# Patient Record
Sex: Female | Born: 1960 | Race: Black or African American | Hispanic: No | Marital: Single | Smoking: Former smoker
Health system: Southern US, Community
[De-identification: ages and names within clinical notes are randomized; demographics above are authoritative.]

## PROBLEM LIST (undated history)

## (undated) DIAGNOSIS — E119 Type 2 diabetes mellitus without complications: Secondary | ICD-10-CM

## (undated) DIAGNOSIS — M542 Cervicalgia: Secondary | ICD-10-CM

## (undated) DIAGNOSIS — Z87891 Personal history of nicotine dependence: Secondary | ICD-10-CM

## (undated) DIAGNOSIS — B009 Herpesviral infection, unspecified: Secondary | ICD-10-CM

## (undated) DIAGNOSIS — Z862 Personal history of diseases of the blood and blood-forming organs and certain disorders involving the immune mechanism: Secondary | ICD-10-CM

## (undated) DIAGNOSIS — D219 Benign neoplasm of connective and other soft tissue, unspecified: Secondary | ICD-10-CM

## (undated) HISTORY — DX: Cervicalgia: M54.2

## (undated) HISTORY — DX: Herpesviral infection, unspecified: B00.9

## (undated) HISTORY — DX: Personal history of diseases of the blood and blood-forming organs and certain disorders involving the immune mechanism: Z86.2

## (undated) HISTORY — DX: Type 2 diabetes mellitus without complications: E11.9

## (undated) HISTORY — DX: Benign neoplasm of connective and other soft tissue, unspecified: D21.9

## (undated) HISTORY — DX: Personal history of nicotine dependence: Z87.891

## (undated) HISTORY — PX: NO PAST SURGERIES: SHX2092

---

## 2007-01-21 ENCOUNTER — Emergency Department (HOSPITAL_COMMUNITY): Admission: EM | Admit: 2007-01-21 | Discharge: 2007-01-21 | Payer: Self-pay | Admitting: Emergency Medicine

## 2010-03-18 ENCOUNTER — Emergency Department (HOSPITAL_BASED_OUTPATIENT_CLINIC_OR_DEPARTMENT_OTHER)
Admission: EM | Admit: 2010-03-18 | Discharge: 2010-03-18 | Payer: Self-pay | Source: Home / Self Care | Admitting: Emergency Medicine

## 2010-04-02 ENCOUNTER — Ambulatory Visit: Admission: RE | Admit: 2010-04-02 | Payer: Self-pay | Source: Home / Self Care | Admitting: Family Medicine

## 2010-04-02 ENCOUNTER — Encounter: Payer: Self-pay | Admitting: Family Medicine

## 2010-04-02 DIAGNOSIS — S139XXA Sprain of joints and ligaments of unspecified parts of neck, initial encounter: Secondary | ICD-10-CM | POA: Insufficient documentation

## 2010-04-11 ENCOUNTER — Encounter: Admit: 2010-04-11 | Discharge: 2010-04-11 | Payer: Self-pay | Attending: Family Medicine | Admitting: Family Medicine

## 2010-04-25 ENCOUNTER — Encounter
Admission: RE | Admit: 2010-04-25 | Discharge: 2010-04-29 | Payer: Self-pay | Source: Home / Self Care | Attending: Family Medicine | Admitting: Family Medicine

## 2010-05-01 ENCOUNTER — Ambulatory Visit: Payer: PRIVATE HEALTH INSURANCE | Admitting: Rehabilitation
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
## 2010-07-08 ENCOUNTER — Emergency Department (HOSPITAL_BASED_OUTPATIENT_CLINIC_OR_DEPARTMENT_OTHER)
Admission: EM | Admit: 2010-07-08 | Discharge: 2010-07-08 | Disposition: A | Discharge status: Home / Self Care | Payer: PRIVATE HEALTH INSURANCE | Attending: Emergency Medicine | Admitting: Emergency Medicine

## 2010-07-08 DIAGNOSIS — M7989 Other specified soft tissue disorders: Secondary | ICD-10-CM | POA: Insufficient documentation

## 2010-07-08 DIAGNOSIS — L089 Local infection of the skin and subcutaneous tissue, unspecified: Secondary | ICD-10-CM | POA: Insufficient documentation

## 2010-10-13 ENCOUNTER — Emergency Department (HOSPITAL_COMMUNITY): Payer: PRIVATE HEALTH INSURANCE

## 2010-10-13 ENCOUNTER — Emergency Department (HOSPITAL_COMMUNITY)
Admission: EM | Admit: 2010-10-13 | Discharge: 2010-10-13 | Disposition: A | Discharge status: Home / Self Care | Payer: PRIVATE HEALTH INSURANCE | Attending: Emergency Medicine | Admitting: Emergency Medicine

## 2010-10-13 DIAGNOSIS — R05 Cough: Secondary | ICD-10-CM | POA: Insufficient documentation

## 2010-10-13 DIAGNOSIS — J40 Bronchitis, not specified as acute or chronic: Secondary | ICD-10-CM | POA: Insufficient documentation

## 2010-10-13 DIAGNOSIS — R059 Cough, unspecified: Secondary | ICD-10-CM | POA: Insufficient documentation

## 2010-10-13 DIAGNOSIS — R062 Wheezing: Secondary | ICD-10-CM | POA: Insufficient documentation

## 2010-10-31 ENCOUNTER — Ambulatory Visit (INDEPENDENT_AMBULATORY_CARE_PROVIDER_SITE_OTHER): Payer: PRIVATE HEALTH INSURANCE | Admitting: Critical Care Medicine

## 2010-10-31 ENCOUNTER — Other Ambulatory Visit: Payer: Self-pay | Admitting: Critical Care Medicine

## 2010-10-31 ENCOUNTER — Encounter: Payer: Self-pay | Admitting: Critical Care Medicine

## 2010-10-31 VITALS — BP 116/77 | HR 49 | Temp 98.1°F | Ht 63.0 in | Wt 227.0 lb

## 2010-10-31 DIAGNOSIS — R05 Cough: Secondary | ICD-10-CM

## 2010-10-31 DIAGNOSIS — J40 Bronchitis, not specified as acute or chronic: Secondary | ICD-10-CM

## 2010-10-31 DIAGNOSIS — R059 Cough, unspecified: Secondary | ICD-10-CM

## 2010-10-31 DIAGNOSIS — J45991 Cough variant asthma: Secondary | ICD-10-CM | POA: Insufficient documentation

## 2010-10-31 MED ORDER — BUDESONIDE 180 MCG/ACT IN AEPB: 1.0000 | INHALATION_SPRAY | Freq: Two times a day (BID) | RESPIRATORY_TRACT | Status: DC

## 2010-10-31 NOTE — Assessment & Plan Note (Signed)
Suspect Cough variant asthma Suspect lower airway inflammation Plan PFTs Start ICS pulmicort two puff bid No systemic steroids
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
## 2010-10-31 NOTE — Patient Instructions (Signed)
Start Pulmicort two puff twice daily A breathing test will be obtained Labs today Ventolin as needed Return 1 month High POint I will call with results

## 2010-11-01 LAB — ALLERGY FULL PROFILE
Alternaria Alternata: <0.1 kU/L (ref <0.35)
Bermuda Grass: <0.1 kU/L (ref <0.35)
Candida Albicans: <0.1 kU/L (ref <0.35)
Cat Dander: <0.1 kU/L (ref <0.35)
Curvularia lunata: <0.1 kU/L (ref <0.35)
Elm IgE: <0.1 kU/L (ref <0.35)
Fescue: 0.18 kU/L (ref <0.35)
G009 Red Top: 0.19 kU/L (ref <0.35)
Goldenrod: <0.1 kU/L (ref <0.35)
Helminthosporium halodes: <0.1 kU/L (ref <0.35)
IgE (Immunoglobulin E), Serum: 169.7 IU/mL (ref 0.0–180.0)
Lamb's Quarters: <0.1 kU/L (ref <0.35)

## 2010-11-06 NOTE — Progress Notes (Signed)
Quick Note:  Called, spoke with pt. She is aware allergy test positive for multiple allergens per PW. She is aware PW will review this with her in greater detail at next OV. She verbalized understanding of this and ok with this plan. ______

## 2010-11-12 ENCOUNTER — Ambulatory Visit (INDEPENDENT_AMBULATORY_CARE_PROVIDER_SITE_OTHER): Payer: PRIVATE HEALTH INSURANCE | Admitting: Critical Care Medicine

## 2010-11-12 DIAGNOSIS — R059 Cough, unspecified: Secondary | ICD-10-CM

## 2010-11-12 DIAGNOSIS — R05 Cough: Secondary | ICD-10-CM

## 2010-11-12 DIAGNOSIS — J40 Bronchitis, not specified as acute or chronic: Secondary | ICD-10-CM

## 2010-11-12 LAB — PULMONARY FUNCTION TEST

## 2010-11-12 NOTE — Progress Notes (Signed)
PFT done today. 

## 2010-11-20 ENCOUNTER — Encounter: Payer: Self-pay | Admitting: Critical Care Medicine

## 2010-11-20 ENCOUNTER — Telehealth: Payer: Self-pay | Admitting: Critical Care Medicine

## 2010-11-20 DIAGNOSIS — J45991 Cough variant asthma: Secondary | ICD-10-CM

## 2010-11-20 NOTE — Telephone Encounter (Signed)
Pfts show mild obstruction,  Corrected DLCO Pt aware

## 2010-11-28 ENCOUNTER — Encounter: Payer: Self-pay | Admitting: Critical Care Medicine

## 2010-11-28 ENCOUNTER — Ambulatory Visit (INDEPENDENT_AMBULATORY_CARE_PROVIDER_SITE_OTHER): Payer: PRIVATE HEALTH INSURANCE | Admitting: Critical Care Medicine

## 2010-11-28 VITALS — BP 112/66 | HR 57 | Temp 97.7°F | Ht 63.0 in | Wt 226.5 lb

## 2010-11-28 DIAGNOSIS — J45991 Cough variant asthma: Secondary | ICD-10-CM

## 2010-11-28 NOTE — Patient Instructions (Signed)
Stay on pulmicort Return 4 months
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
## 2010-11-28 NOTE — Assessment & Plan Note (Addendum)
Suspect Cough variant asthma Suspect lower airway inflammation FeV1 75% Fef 25 75 92%  TLC 69%  DLCO /VA 140%  11/12/10 Ig E  170 RAST assay positive multiple allergies Improved on ICS Plan  Cont IcS   b id

## 2010-12-05 ENCOUNTER — Ambulatory Visit: Payer: PRIVATE HEALTH INSURANCE | Admitting: Critical Care Medicine

## 2010-12-08 ENCOUNTER — Emergency Department (HOSPITAL_COMMUNITY): Payer: PRIVATE HEALTH INSURANCE

## 2010-12-08 ENCOUNTER — Emergency Department (HOSPITAL_COMMUNITY)
Admission: EM | Admit: 2010-12-08 | Discharge: 2010-12-08 | Disposition: A | Discharge status: Home / Self Care | Payer: PRIVATE HEALTH INSURANCE | Attending: Emergency Medicine | Admitting: Emergency Medicine

## 2010-12-08 DIAGNOSIS — M7989 Other specified soft tissue disorders: Secondary | ICD-10-CM | POA: Insufficient documentation

## 2010-12-08 DIAGNOSIS — M79609 Pain in unspecified limb: Secondary | ICD-10-CM | POA: Insufficient documentation

## 2012-07-30 ENCOUNTER — Ambulatory Visit: Payer: PRIVATE HEALTH INSURANCE | Admitting: Internal Medicine

## 2012-11-17 ENCOUNTER — Encounter: Payer: Self-pay | Admitting: Obstetrics & Gynecology

## 2012-11-18 ENCOUNTER — Ambulatory Visit: Payer: Self-pay | Admitting: Obstetrics & Gynecology

## 2012-11-18 ENCOUNTER — Telehealth: Payer: Self-pay | Admitting: Obstetrics & Gynecology

## 2012-11-18 NOTE — Telephone Encounter (Signed)
Patient called to ask if she could have a pus completed the same day as her annual (8/26). I explained the process for having a PUS completed. She understands the process and is able to come in on Thursday (8/28) if we can get a pus approved. Patient has fibroids and would like to have a pus to determine if they have changed at all. Patient did not mention any symptoms.

## 2012-11-18 NOTE — Telephone Encounter (Signed)
OK to go ahead and precert.  DX:  218.9.

## 2012-11-23 ENCOUNTER — Ambulatory Visit: Payer: Self-pay | Admitting: Obstetrics & Gynecology

## 2012-11-23 NOTE — Telephone Encounter (Signed)
Returning your phone call.

## 2012-11-26 NOTE — Telephone Encounter (Signed)
Patient scheduled for PUS on 12/02/12

## 2012-11-29 ENCOUNTER — Other Ambulatory Visit: Payer: Self-pay | Admitting: Obstetrics & Gynecology

## 2012-11-29 DIAGNOSIS — D219 Benign neoplasm of connective and other soft tissue, unspecified: Secondary | ICD-10-CM

## 2012-12-02 ENCOUNTER — Ambulatory Visit (INDEPENDENT_AMBULATORY_CARE_PROVIDER_SITE_OTHER): Payer: PRIVATE HEALTH INSURANCE | Admitting: Obstetrics & Gynecology

## 2012-12-02 ENCOUNTER — Ambulatory Visit (INDEPENDENT_AMBULATORY_CARE_PROVIDER_SITE_OTHER): Payer: PRIVATE HEALTH INSURANCE

## 2012-12-02 VITALS — BP 124/80 | Ht 63.25 in | Wt 247.0 lb

## 2012-12-02 DIAGNOSIS — K5900 Constipation, unspecified: Secondary | ICD-10-CM

## 2012-12-02 DIAGNOSIS — N852 Hypertrophy of uterus: Secondary | ICD-10-CM

## 2012-12-02 DIAGNOSIS — D259 Leiomyoma of uterus, unspecified: Secondary | ICD-10-CM

## 2012-12-02 DIAGNOSIS — D219 Benign neoplasm of connective and other soft tissue, unspecified: Secondary | ICD-10-CM

## 2012-12-02 DIAGNOSIS — Z789 Other specified health status: Secondary | ICD-10-CM

## 2012-12-02 NOTE — Progress Notes (Signed)
52 y.o.Singlefemale here for a pelvic ultrasound.  Having cramping pelvic pain, especially on the left side.  Known hx of uterine fibroids.  Pt anxious they are larger.  No vaginal bleeding since January2.  Having some hot flashes, mild night sweats.  Still sleeping okay.  Has taken nothing for the pain except OTC medications.  Patient's last menstrual period was 04/25/2012.  Sexually active:  yes  Contraception: no method  FINDINGS: UTERUS: 7.4 x 5.5 x 4.1cm with multiple (9 noted today) fibroids, largest 2.1 x 1.6cm.  Prior ultrasound 2/12 showed uterine 9.8 x 5.6 x 6.3cm with around 10 fibroids noted).  Overall significant decrease in size. EMS: 3.105mm ADNEXA:   Left ovary 1.9 x 0.7 x 0.7cm   Right ovary 2.1 x 1.3 x 1.4cm CUL DE SAC: neg  D/W pt findings.  Images reviewed together.  Feel this is not cause of her pain.  D/W pt other causes of LLQ pain, especially GI causes.  She admitted to using very frequent laxatives until just three months ago.  Pain began after that time.  Advised pt to start daily Colace and use Miralax, in addition, if needed.  She is very Adult nurse and voices understanding.  Assessment:  Uterine fibroids, uterus much smaller in size Constipation  Plan: F/U for AEX about 6 months Colace and Miralax discussed for constipation relief.    ~25 minutes spent with patient >50% of time was in face to face discussion of above.

## 2012-12-03 ENCOUNTER — Ambulatory Visit: Payer: Self-pay | Admitting: Obstetrics & Gynecology

## 2012-12-08 ENCOUNTER — Encounter: Payer: Self-pay | Admitting: Obstetrics & Gynecology

## 2012-12-08 NOTE — Patient Instructions (Signed)
Start Colace 100mg  daily to twice daily.  May use Miralax as needed as well.

## 2013-04-12 ENCOUNTER — Encounter: Payer: Self-pay | Admitting: Obstetrics & Gynecology

## 2013-04-12 ENCOUNTER — Ambulatory Visit: Payer: Self-pay | Admitting: Obstetrics & Gynecology

## 2013-11-08 ENCOUNTER — Ambulatory Visit: Payer: Self-pay | Admitting: Obstetrics & Gynecology

## 2013-11-18 ENCOUNTER — Encounter: Payer: Self-pay | Admitting: Obstetrics and Gynecology

## 2013-11-18 ENCOUNTER — Ambulatory Visit (INDEPENDENT_AMBULATORY_CARE_PROVIDER_SITE_OTHER): Payer: PRIVATE HEALTH INSURANCE | Admitting: Obstetrics and Gynecology

## 2013-11-18 VITALS — BP 118/70 | HR 70 | Resp 18 | Ht 63.0 in | Wt 224.0 lb

## 2013-11-18 DIAGNOSIS — D219 Benign neoplasm of connective and other soft tissue, unspecified: Secondary | ICD-10-CM | POA: Insufficient documentation

## 2013-11-18 DIAGNOSIS — D259 Leiomyoma of uterus, unspecified: Secondary | ICD-10-CM

## 2013-11-18 DIAGNOSIS — N951 Menopausal and female climacteric states: Secondary | ICD-10-CM

## 2013-11-18 DIAGNOSIS — N950 Postmenopausal bleeding: Secondary | ICD-10-CM

## 2013-11-18 DIAGNOSIS — Z1211 Encounter for screening for malignant neoplasm of colon: Secondary | ICD-10-CM

## 2013-11-18 DIAGNOSIS — Z01419 Encounter for gynecological examination (general) (routine) without abnormal findings: Secondary | ICD-10-CM

## 2013-11-18 NOTE — Patient Instructions (Signed)

## 2013-11-18 NOTE — Progress Notes (Signed)
Patient ID: Felicia Ramirez, female   DOB: 06/08/1960, 53 y.o.   MRN: [id number] GYNECOLOGY VISIT  PCP:   Erick Blinks, MD (Endocrinologist)  Referring provider:   HPI: 53 y.o.   Single  African American  female   [id number] with Patient's last menstrual period was 07/29/2013.   here for  AEX.  Some hot flashes and certain foods bring on the heat - spicy foods or pork.  If avoids certain foods, does not have hot flashes.  Some anxiety and agitation.  No prior treatment for depression or anxiety.  Mood swings have improved.  Declines medication that could cause weight gain.  Has a history of abnormal thyroid function.  Due for a recheck.   Has know fibroids of uterus.  Seen last year and had ultrasound. Recommendation for treatment of constipation with Colace and Miralax.   Diagnosed with prediabetes last year.   Hgb:    With Endocrinologist Urine:  Neg UPT;  Negative  GYNECOLOGIC HISTORY: Patient's last menstrual period was 07/29/2013. Sexually active: yes  Partner preference: female Contraception:  none  Menopausal hormone therapy: n/a DES exposure:   no Blood transfusions: no   Sexually transmitted diseases:   HSV GYN procedures and prior surgeries:  none Last mammogram: 11-18-12 wnl: [facility].  Appointment scheduled for next week.               Last pap and high risk HPV testing:   11-12-11 wnl:neg HR HPV History of abnormal pap smear:  no   OB History   Grav Para Term Preterm Abortions TAB SAB Ect Mult Living   1 1 1       1        LIFESTYLE: Exercise:    walking            OTHER HEALTH MAINTENANCE: Tetanus/TDap:  01-30-06 HPV:                   n/a Influenza:           never   Bone density:    n/a Colonoscopy:   never  Cholesterol check:  Unsure--thought she had last year and normal.  Family History  Problem Relation Age of Onset  . Throat cancer Maternal Grandmother   . Asthma Daughter   . Diabetes Mother   . Hypertension Mother   .  Hypertension Sister   . Diabetes Brother   . Hypertension Brother     Patient Active Problem List   Diagnosis Date Noted  . Asthma, cough variant 10/31/2010  . CERVICAL STRAIN 04/02/2010   Past Medical History  Diagnosis Date  . ALLERGIC RHINITIS   . Asthma     cough variant  . HSV-2 (herpes simplex virus 2) infection     + Bloodwork  . History of anemia   . Fibroid   . Former smoker, stopped smoking in distant past     quit 2001  . Diabetes mellitus without complication     History reviewed. No pertinent past surgical history.  ALLERGIES: Review of patient's allergies indicates no known allergies.  Current Outpatient Prescriptions  Medication Sig Dispense Refill  . albuterol (VENTOLIN HFA) 108 (90 BASE) MCG/ACT inhaler Inhale 2 puffs into the lungs every 6 (six) hours as needed. Pt takes as needed       . cyclobenzaprine (FLEXERIL) 5 MG tablet Take 5 mg by mouth as needed for muscle spasms.      . Fe Fum-FePoly-FA-Vit C-Vit B3 (INTEGRA F  PO) Take by mouth. Pt taking only when on cycle       . ibuprofen (ADVIL,MOTRIN) 800 MG tablet Take 800 mg by mouth every 8 (eight) hours as needed.        . Liraglutide (VICTOZA) 18 MG/3ML SOPN Inject 12 Units into the skin daily.      . Multiple Vitamin (DAILY MULTIVITAMIN PO) Take by mouth once. Pt takes 1 daily       . budesonide (PULMICORT FLEXHALER) 180 MCG/ACT inhaler Inhale 1 puff into the lungs 2 (two) times daily.  1 each  5   No current facility-administered medications for this visit.     ROS:  Pertinent items are noted in HPI.  History   Social History  . Marital Status: Single    Spouse Name: N/A    Number of Children: N/A  . Years of Education: N/A   Occupational History  . Not on file.   Social History Main Topics  . Smoking status: Former Smoker -- 0.25 packs/day for 11 years    Types: Cigarettes    Quit date: 05/20/2000  . Smokeless tobacco: Never Used  . Alcohol Use: No  . Drug Use: Not on file  .  Sexual Activity: Yes    Partners: Male    Birth Control/ Protection: None   Other Topics Concern  . Not on file   Social History Narrative  . No narrative on file    PHYSICAL EXAMINATION:    BP 118/70  Pulse 70  Resp 18  Ht 5\' 3"  (1.6 m)  Wt 224 lb (101.606 kg)  BMI 39.69 kg/m2  LMP 07/29/2013   Wt Readings from Last 3 Encounters:  11/18/13 224 lb (101.606 kg)  12/02/12 247 lb (112.038 kg)  11/28/10 226 lb 8 oz (102.74 kg)     Ht Readings from Last 3 Encounters:  11/18/13 5\' 3"  (1.6 m)  12/02/12 5' 3.25" (1.607 m)  11/28/10 5\' 3"  (1.6 m)    General appearance: alert, cooperative and appears stated age Head: Normocephalic, without obvious abnormality, atraumatic Neck: no adenopathy, supple, symmetrical, trachea midline and thyroid not enlarged, symmetric, no tenderness/mass/nodules Lungs: clear to auscultation bilaterally Breasts: Inspection negative, No nipple retraction or dimpling, No nipple discharge or bleeding, No axillary or supraclavicular adenopathy, Normal to palpation without dominant masses Heart: regular rate and rhythm Abdomen: pannus with dark discoloration of skin underneath, soft, non-tender; no masses,  no organomegaly Extremities: extremities normal, atraumatic, no cyanosis or edema Skin: Skin color, texture, turgor normal. No rashes or lesions Lymph nodes: Cervical, supraclavicular, and axillary nodes normal. No abnormal inguinal nodes palpated Neurologic: Grossly normal  Pelvic: External genitalia:  no lesions              Urethra:  normal appearing urethra with no masses, tenderness or lesions              Bartholins and Skenes: normal                 Vagina: normal appearing vagina with normal color and discharge, no lesions              Cervix: normal appearance              Pap and high risk HPV testing done: No.        Bimanual Exam:  Uterus:  uterus is normal size, shape, consistency and nontender. 8 - 9 week size, but exam limited by body  habitus.  Adnexa: normal adnexa in size, nontender and no masses                                      Rectovaginal:  Yes.                                        Confirms above.                                      Anus:  normal sphincter tone, no lesions  ASSESSMENT  Normal gynecologic exam. History of fibroids.  Asymptomatic.  Menopausal symptoms.   Mood swings and agitation.  Prediabetic per patient.   PLAN  Mammogram recommended yearly starting at age 19. Pap smear and high risk HPV testing as above. Counseled on self breast exam, Calcium and vitamin D intake, exercise. See lab orders: Yes.  Check FSH and estradiol.  Discussed treatment of menopausal symptoms with HRT and Effexor or Brisdelle.  Declines treatment at this time.  Menopause brochure.  Referral for colonoscopy.  Return annually or prn   An After Visit Summary was printed and given to the patient.

## 2013-11-19 LAB — ESTRADIOL: ESTRADIOL: 17.5 pg/mL

## 2013-11-19 LAB — FOLLICLE STIMULATING HORMONE: FSH: 56.1 m[IU]/mL

## 2013-12-13 ENCOUNTER — Encounter: Payer: Self-pay | Admitting: Obstetrics and Gynecology

## 2014-01-10 ENCOUNTER — Telehealth: Payer: Self-pay | Admitting: Obstetrics and Gynecology

## 2014-01-10 NOTE — Telephone Encounter (Signed)
Spoke with patient. Advised referral was placed over to Dr.Mann's office. Patient would like number to their office. Number provided to Dr.Mann's office [phone]. Patient is agreeable and verbalizes understanding.  Routing to provider for final review. Patient agreeable to disposition. Will close encounter

## 2014-01-10 NOTE — Telephone Encounter (Signed)
Pt says she does not remember the name of the Dr she was referred to for a colonoscopy. Would like nurse to call her with that information.

## 2014-01-30 ENCOUNTER — Encounter: Payer: Self-pay | Admitting: Obstetrics and Gynecology

## 2014-07-20 ENCOUNTER — Ambulatory Visit (INDEPENDENT_AMBULATORY_CARE_PROVIDER_SITE_OTHER): Payer: PRIVATE HEALTH INSURANCE | Admitting: Neurology

## 2014-07-20 ENCOUNTER — Encounter: Payer: Self-pay | Admitting: Neurology

## 2014-07-20 VITALS — BP 129/91 | HR 68 | Ht 63.0 in | Wt 232.0 lb

## 2014-07-20 DIAGNOSIS — M542 Cervicalgia: Secondary | ICD-10-CM | POA: Diagnosis not present

## 2014-07-20 MED ORDER — DULOXETINE HCL 60 MG PO CPEP: 60.0000 mg | ORAL_CAPSULE | Freq: Every day | ORAL | Status: DC
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
## 2014-12-07 ENCOUNTER — Ambulatory Visit: Payer: PRIVATE HEALTH INSURANCE | Admitting: Obstetrics and Gynecology

## 2014-12-20 ENCOUNTER — Ambulatory Visit: Payer: PRIVATE HEALTH INSURANCE | Admitting: Obstetrics and Gynecology

## 2015-03-02 ENCOUNTER — Other Ambulatory Visit: Payer: Self-pay | Admitting: Allergy and Immunology

## 2015-03-15 ENCOUNTER — Encounter: Payer: Self-pay | Admitting: Obstetrics and Gynecology

## 2015-03-15 ENCOUNTER — Ambulatory Visit (INDEPENDENT_AMBULATORY_CARE_PROVIDER_SITE_OTHER): Payer: PRIVATE HEALTH INSURANCE | Admitting: Obstetrics and Gynecology

## 2015-03-15 VITALS — BP 116/74 | HR 70 | Resp 16 | Ht 64.0 in | Wt 245.4 lb

## 2015-03-15 DIAGNOSIS — Z01419 Encounter for gynecological examination (general) (routine) without abnormal findings: Secondary | ICD-10-CM | POA: Diagnosis not present

## 2015-03-15 DIAGNOSIS — Z113 Encounter for screening for infections with a predominantly sexual mode of transmission: Secondary | ICD-10-CM | POA: Diagnosis not present

## 2015-03-15 DIAGNOSIS — Z0000 Encounter for general adult medical examination without abnormal findings: Secondary | ICD-10-CM

## 2015-03-15 LAB — POCT URINALYSIS DIPSTICK
BILIRUBIN UA: NEGATIVE
GLUCOSE UA: NEGATIVE
Ketones, UA: NEGATIVE
LEUKOCYTES UA: NEGATIVE
NITRITE UA: NEGATIVE
Protein, UA: NEGATIVE
RBC UA: NEGATIVE
Urobilinogen, UA: NEGATIVE
pH, UA: 5

## 2015-03-15 MED ORDER — FLUCONAZOLE 150 MG PO TABS: 150.0000 mg | ORAL_TABLET | Freq: Once | ORAL | Status: DC

## 2015-03-15 MED ORDER — NYSTATIN 100000 UNIT/GM EX POWD: Freq: Three times a day (TID) | CUTANEOUS | Status: DC

## 2015-03-15 NOTE — Patient Instructions (Signed)

## 2015-03-15 NOTE — Progress Notes (Signed)
Patient ID: Felicia Ramirez, female   DOB: Oct 11, 1960, 54 y.o.   MRN: [id number] 54 y.o. G1P1001 Single African American female here for annual exam.    No menses for 1.5 years.  Some cramping periodically.  Hot flashes but these are tolerable.  Not taking Cymbalta.  Some situational stress due to home situation.  Not suicidal.  Trying to purchase a home and separate from her partner.  He was unfaithful.   Is sexually active with him. Father of her daughter living at home with her.  Daughter is 30 yo.  Off diabetes medication.   Enjoying wine sometimes on the weekends.   PCP: [facility].   Patient's last menstrual period was 07/29/2013 (approximate).          Sexually active: Yes.  female  The current method of family planning is post menopausal status.    Exercising: Yes.    runs up and down stairs. Smoker:  former  Health Maintenance: Pap:  11-12-11 Neg:Neg HR HPV History of abnormal Pap:  no MMG:  12-14-14 density cat.B/poss.mass Left.br.;Left Diag.and U/S--normal intramammary lymph node Lt.br.12 o'clock and Lt.br.at 7 o'clock focally dilated duct/benign:return to screening:[facility] Colonoscopy:  05-05-14 benign polyps with Dr. Lenise Herald due 05/2024. BMD:   n/a  Result  n/a TDaP:  12-08-13 Screening Labs:   Urine today: Neg   reports that she quit smoking about 14 years ago. Her smoking use included Cigarettes. She has a 2.75 pack-year smoking history. She has never used smokeless tobacco. She reports that she drinks about 0.6 oz of alcohol per week. She reports that she does not use illicit drugs.  Past Medical History  Diagnosis Date  . ALLERGIC RHINITIS   . Asthma     cough variant  . HSV-2 (herpes simplex virus 2) infection     + Bloodwork  . History of anemia   . Fibroid   . Former smoker, stopped smoking in distant past     quit 2001  . Diabetes mellitus without complication ([place])   . Neck pain     Past Surgical History  Procedure  Laterality Date  . No past surgeries      Current Outpatient Prescriptions  Medication Sig Dispense Refill  . albuterol (VENTOLIN HFA) 108 (90 BASE) MCG/ACT inhaler Inhale 2 puffs into the lungs every 6 (six) hours as needed. Pt takes as needed     . cyclobenzaprine (FLEXERIL) 5 MG tablet Take 5 mg by mouth as needed for muscle spasms.    . DULoxetine (CYMBALTA) 60 MG capsule Take 1 capsule (60 mg total) by mouth daily. 30 capsule 11  . Fe Fum-FePoly-FA-Vit C-Vit B3 (INTEGRA F PO) Take by mouth. Pt taking only when on cycle     . ibuprofen (ADVIL,MOTRIN) 800 MG tablet Take 800 mg by mouth every 8 (eight) hours as needed.      . Multiple Vitamin (DAILY MULTIVITAMIN PO) Take by mouth once. Pt takes 1 daily     . budesonide (PULMICORT FLEXHALER) 180 MCG/ACT inhaler Inhale 1 puff into the lungs 2 (two) times daily. 1 each 5   No current facility-administered medications for this visit.    Family History  Problem Relation Age of Onset  . Throat cancer Maternal Grandmother   . Asthma Daughter   . Diabetes Mother   . Hypertension Mother   . Hypertension Sister   . Diabetes Brother   . Hypertension Brother     ROS:  Pertinent items are noted in  HPI.  Otherwise, a comprehensive ROS was negative.  Exam:   BP 116/74 mmHg  Pulse 70  Resp 16  Ht 5\' 4"  (1.626 m)  Wt 245 lb 6.4 oz (111.313 kg)  BMI 42.10 kg/m2  LMP 07/29/2013 (Approximate)    General appearance: alert, cooperative and appears stated age Head: Normocephalic, without obvious abnormality, atraumatic Neck: no adenopathy, supple, symmetrical, trachea midline and thyroid normal to inspection and palpation Lungs: clear to auscultation bilaterally Breasts: normal appearance, no masses or tenderness, Inspection negative, No nipple retraction or dimpling, No nipple discharge or bleeding, No axillary or supraclavicular adenopathy Hypopigmentation of areas under breast.  Using triamcinolone chronically. ) Heart: regular rate and  rhythm Abdomen: soft, non-tender; bowel sounds normal; no masses,  no organomegaly Extremities: extremities normal, atraumatic, no cyanosis or edema Skin: Skin color, texture, turgor normal. No rashes or lesions Lymph nodes: Cervical, supraclavicular, and axillary nodes normal. No abnormal inguinal nodes palpated Neurologic: Grossly normal  Pelvic: External genitalia:  Thickening of epidermis consistent with inflammation and chronic yeast.              Urethra:  normal appearing urethra with no masses, tenderness or lesions              Bartholins and Skenes: normal                 Vagina: normal appearing vagina with normal color and discharge, no lesions              Cervix: no lesions              Pap taken: Yes.   Bimanual Exam:  Uterus:  normal size, contour, position, consistency, mobility, non-tender and Bimanual exam limited by body habitus.              Adnexa: normal adnexa and no mass, fullness, tenderness              Rectovaginal: Yes.  .  Confirms.              Anus:  normal sphincter tone, no lesions  Chaperone was present for exam.  Assessment:   Well woman visit with normal exam. Hx fibroids.  Hx HSV. Candida infection of under breast areas and groin/vulva.  Plan: Yearly mammogram recommended after age 36.  Will start going to [place].  Recommended self breast exam.  Pap and HR HPV as above. Discussed Calcium, Vitamin D, regular exercise program including cardiovascular and weight bearing exercise. Labs performed.  Yes.  .   See orders.  STD testing.  Affirm.  Encouraged condom use.  Routine labs with PCP office.  Refills given on medications.  Yes.  .  See orders.  Dilfucan 150 mg po x1 and repeat in 48 hours prn. #2, RF one.  Nystatin powder to area tid prn.  Discussed stopping routine use of the triamcinolone and instead treating what is likely chronic yeast infection.  Follow up annually and prn.     After visit summary provided.

## 2015-03-16 ENCOUNTER — Other Ambulatory Visit: Payer: Self-pay | Admitting: Obstetrics and Gynecology

## 2015-03-16 LAB — HEPATITIS C ANTIBODY: HCV AB: NEGATIVE

## 2015-03-16 LAB — STD PANEL
HIV 1&2 Ab, 4th Generation: NONREACTIVE
Hepatitis B Surface Ag: NEGATIVE

## 2015-03-16 LAB — WET PREP BY MOLECULAR PROBE
CANDIDA SPECIES: NEGATIVE
GARDNERELLA VAGINALIS: POSITIVE — AB
TRICHOMONAS VAG: NEGATIVE

## 2015-03-16 LAB — GC/CHLAMYDIA PROBE AMP, URINE
Chlamydia, Swab/Urine, PCR: NOT DETECTED
GC PROBE AMP, URINE: NOT DETECTED

## 2015-03-16 MED ORDER — METRONIDAZOLE 500 MG PO TABS: 500.0000 mg | ORAL_TABLET | Freq: Two times a day (BID) | ORAL | Status: DC

## 2015-03-19 LAB — IPS PAP TEST WITH HPV

## 2015-07-17 ENCOUNTER — Encounter: Payer: Self-pay | Admitting: Internal Medicine

## 2015-07-17 ENCOUNTER — Ambulatory Visit (INDEPENDENT_AMBULATORY_CARE_PROVIDER_SITE_OTHER): Payer: Self-pay | Admitting: Internal Medicine

## 2015-07-17 VITALS — BP 149/89 | HR 67 | Temp 98.4°F | Ht 63.5 in | Wt 251.1 lb

## 2015-07-17 DIAGNOSIS — Z91048 Other nonmedicinal substance allergy status: Secondary | ICD-10-CM

## 2015-07-17 DIAGNOSIS — Z7951 Long term (current) use of inhaled steroids: Secondary | ICD-10-CM

## 2015-07-17 DIAGNOSIS — E119 Type 2 diabetes mellitus without complications: Secondary | ICD-10-CM | POA: Insufficient documentation

## 2015-07-17 DIAGNOSIS — J45991 Cough variant asthma: Secondary | ICD-10-CM

## 2015-07-17 DIAGNOSIS — B372 Candidiasis of skin and nail: Secondary | ICD-10-CM | POA: Insufficient documentation

## 2015-07-17 DIAGNOSIS — Z7689 Persons encountering health services in other specified circumstances: Secondary | ICD-10-CM | POA: Insufficient documentation

## 2015-07-17 DIAGNOSIS — E559 Vitamin D deficiency, unspecified: Secondary | ICD-10-CM | POA: Insufficient documentation

## 2015-07-17 DIAGNOSIS — D72819 Decreased white blood cell count, unspecified: Secondary | ICD-10-CM | POA: Insufficient documentation

## 2015-07-17 DIAGNOSIS — Z9109 Other allergy status, other than to drugs and biological substances: Secondary | ICD-10-CM | POA: Insufficient documentation

## 2015-07-17 LAB — GLUCOSE, CAPILLARY: GLUCOSE-CAPILLARY: 108 mg/dL — AB (ref 65–99)

## 2015-07-17 LAB — POCT GLYCOSYLATED HEMOGLOBIN (HGB A1C): HEMOGLOBIN A1C: 6.2

## 2015-07-17 MED ORDER — CETIRIZINE HCL 10 MG PO CHEW: 10.0000 mg | CHEWABLE_TABLET | Freq: Every day | ORAL | Status: DC

## 2015-07-17 MED ORDER — BUDESONIDE 180 MCG/ACT IN AEPB: 1.0000 | INHALATION_SPRAY | Freq: Two times a day (BID) | RESPIRATORY_TRACT | Status: DC

## 2015-07-17 MED ORDER — MONTELUKAST SODIUM 10 MG PO TABS: 10.0000 mg | ORAL_TABLET | Freq: Every day | ORAL | Status: DC

## 2015-07-17 MED ORDER — NYSTATIN 100000 UNIT/GM EX POWD: Freq: Three times a day (TID) | CUTANEOUS | Status: DC

## 2015-07-17 MED ORDER — KETOCONAZOLE 2 % EX CREA: 1.0000 "application " | TOPICAL_CREAM | Freq: Every day | CUTANEOUS | Status: DC

## 2015-07-17 NOTE — Patient Instructions (Addendum)
Start taking zyrtec everyday for allergies.   Work on weight loss, diet, and exercise!

## 2015-07-17 NOTE — Progress Notes (Signed)
Subjective:   Patient ID: Felicia Ramirez female   DOB: 1960-10-19 55 y.o.   MRN: [id number]  HPI: Felicia Ramirez is a 55 y.o. with past medical history as outlined below who presents to clinic to establish care.   Please see problem list for status of the pt's chronic medical problems.  Past Medical History  Diagnosis Date  . ALLERGIC RHINITIS   . Asthma     cough variant  . HSV-2 (herpes simplex virus 2) infection     + Bloodwork  . History of anemia   . Fibroid   . Former smoker, stopped smoking in distant past     quit 2001  . Diabetes mellitus without complication ([place])   . Neck pain    Current Outpatient Prescriptions  Medication Sig Dispense Refill  . albuterol (VENTOLIN HFA) 108 (90 BASE) MCG/ACT inhaler Inhale 2 puffs into the lungs every 6 (six) hours as needed. Pt takes as needed     . budesonide (PULMICORT FLEXHALER) 180 MCG/ACT inhaler Inhale 1 puff into the lungs 2 (two) times daily. 1 each 5  . cetirizine (ZYRTEC) 10 MG chewable tablet Chew 1 tablet (10 mg total) by mouth daily. 30 tablet 11  . cyclobenzaprine (FLEXERIL) 5 MG tablet Take 5 mg by mouth as needed for muscle spasms.    . DULoxetine (CYMBALTA) 60 MG capsule Take 1 capsule (60 mg total) by mouth daily. 30 capsule 11  . Fe Fum-FePoly-FA-Vit C-Vit B3 (INTEGRA F PO) Take by mouth. Pt taking only when on cycle     . ibuprofen (ADVIL,MOTRIN) 800 MG tablet Take 800 mg by mouth every 8 (eight) hours as needed.      Marland Kitchen ketoconazole (NIZORAL) 2 % cream Apply 1 application topically daily. X 2 weeks 15 g 0  . montelukast (SINGULAIR) 10 MG tablet Take 1 tablet (10 mg total) by mouth daily. 30 tablet   . Multiple Vitamin (DAILY MULTIVITAMIN PO) Take by mouth once. Pt takes 1 daily     . nystatin (MYCOSTATIN) powder Apply topically 3 (three) times daily. Apply to affected area for up to 7 days 15 g 3   No current facility-administered medications for this visit.   Family History  Problem Relation  Age of Onset  . Throat cancer Maternal Grandmother   . Asthma Daughter   . Diabetes Mother   . Hypertension Mother   . Hypertension Sister   . Diabetes Brother   . Hypertension Brother    Social History   Social History  . Marital Status: Single    Spouse Name: N/A  . Number of Children: 1  . Years of Education: 12   Occupational History  . CNA    Social History Main Topics  . Smoking status: Former Smoker -- 0.25 packs/day for 11 years    Types: Cigarettes    Quit date: 05/20/2000  . Smokeless tobacco: Never Used  . Alcohol Use: 0.6 oz/week    1 Standard drinks or equivalent per week     Comment: Occasional glass of wine.1 glass every 2-3 weeks  . Drug Use: No  . Sexual Activity:    Partners: Male    Birth Control/ Protection: Post-menopausal   Other Topics Concern  . None   Social History Narrative   Lives at home alone.   Right-handed.   Occasional use of caffeine.   Review of Systems: Review of Systems  Constitutional: Negative for fever, chills and weight loss.  Eyes: Negative for blurred  vision.  Respiratory: Positive for shortness of breath (notices at night) and wheezing.   Cardiovascular: Negative for chest pain and orthopnea.  Gastrointestinal: Negative for nausea, vomiting, abdominal pain, diarrhea and blood in stool.  Genitourinary: Negative.   Musculoskeletal: Negative for falls.       B/l foot pain controlled w/ gabapentin   Neurological: Negative for weakness.    Objective:  Physical Exam: Filed Vitals:   07/17/15 1444  BP: 149/89  Pulse: 67  Temp: 98.4 F (36.9 C)  TempSrc: Oral  Height: 5' 3.5" (1.613 m)  Weight: 251 lb 1.6 oz (113.898 kg)  SpO2: 100%   Physical Exam  Constitutional: She appears well-developed and well-nourished. No distress.  HENT:  Head: Normocephalic and atraumatic.  Nose: Nose normal.  Eyes: Conjunctivae and EOM are normal. No scleral icterus.  Cardiovascular: Normal rate, regular rhythm and normal heart  sounds.  Exam reveals no gallop and no friction rub.   No murmur heard. Pulmonary/Chest: Effort normal and breath sounds normal. No respiratory distress. She has no wheezes. She has no rales.  Abdominal: Soft. Bowel sounds are normal. She exhibits no distension. There is no tenderness. There is no rebound.  Musculoskeletal: She exhibits edema (1+ b/l pedal edema).  Skin: Skin is warm and dry. No rash noted. She is not diaphoretic. No erythema. No pallor.    Assessment & Plan:   Please see problem based assessment and plan.

## 2015-07-18 ENCOUNTER — Telehealth: Payer: Self-pay | Admitting: *Deleted

## 2015-07-18 NOTE — Assessment & Plan Note (Addendum)
Pt states she has been having wheezing and SOB at night. She was given montelukast by her former PCP or ob/gyn which she has been taking prn and feel does not help. She has not been using pulmicort regularly. She has two albuterol inhalers with her and was under the impression that they were two different inhalers although were just different brands. On physical exam pt does not have any wheezing or crackles. Likely her SOB and wheezing at night are due to non compliance w/ her inhalers.   - refilled pulmicort and instructed pt to take it BID, if sx are not improved can increased pulmicort dose - instructed pt to take singulair daily and educated on albuterol as rescue inhalers.  - started on zyrtec as she has severe allergies and previously followed w/ an allergist.

## 2015-07-18 NOTE — Assessment & Plan Note (Signed)
Lab Results  Component Value Date   HGBA1C 6.2 07/17/2015     Assessment: Diabetes control:  controlled Progress toward A1C goal:   at goal Comments: pt has not been on any medications x 1 month. She was previously on a glipizide. She brought in her medications and her metformin bottle was with it but she states she has not been taking it due to GI SE.  Plan: Medications:  Continue w/ diet control Home glucose monitoring: Frequency:   Timing:   Instruction/counseling given: discussed the need for weight loss and discussed diet Educational resources provided:   Self management tools provided:   Other plans: f/u in 3 months for a1c check, counseled on weight loss and diet to help with her DM and BP control

## 2015-07-18 NOTE — Telephone Encounter (Signed)
CALLED PATIENT, UNABLE TO LEAVE MESSAGE. PATIENT NEEDS TO COME BACK IN TO GET LABS. PATIENT LEFT WITHOUT GETTING HER LABS.

## 2015-07-18 NOTE — Assessment & Plan Note (Signed)
Pt requesting ketoconazole 2% cream for her candidal skin infxn beneath her breast. She has tried nystatin powder without improvement and started using her cousin's ketoconazole cream which gave her instant relief.   - rx for ketoconazole cream and refilled nystatin cream - advised to wear cotton bras

## 2015-07-18 NOTE — Assessment & Plan Note (Signed)
Pt is allergic to multiple allergens when tested in 2012. She is not on any allergy meds.   - started on zyrtec

## 2015-07-18 NOTE — Assessment & Plan Note (Signed)
WBC 3 on care every on 10/2014. She was taken of her glipizide in case it was causing leukopenia (UTD lists 1% of leukopenia w/ glipizide use).   - ordered CBC to f/u on leukopenia, pt did not get labs done, will attempt to get her to come in for labs.

## 2015-07-18 NOTE — Assessment & Plan Note (Signed)
Pt brought in all her meds w/ her including an empty bottle of Vit D 50,000. Ordered a Vit D level but pt left w/o getting labs done. RN attempted to call pt but was unsuccessful.

## 2015-07-20 NOTE — Progress Notes (Signed)
Case discussed with Dr. Truong at the time of the visit.  We reviewed the resident's history and exam and pertinent patient test results.  I agree with the assessment, diagnosis and plan of care documented in the resident's note. 

## 2015-07-20 NOTE — Addendum Note (Signed)
Addended by: Oval Linsey D on: 07/20/2015 10:17 PM   Modules accepted: Level of Service

## 2015-07-25 ENCOUNTER — Ambulatory Visit: Payer: Self-pay

## 2015-08-07 ENCOUNTER — Other Ambulatory Visit: Payer: Self-pay | Admitting: *Deleted

## 2015-08-07 NOTE — Telephone Encounter (Signed)
Pt walked in after seeing debh. For "orange card". She ask for her meds to be sent to [facility], i called in her scripts to [facility], she will need to apply for map for pulmicort inhaler  Dr Maudie Mercury, can you consult w/ this pt and see if you can help

## 2015-08-08 ENCOUNTER — Ambulatory Visit: Payer: Self-pay | Admitting: Pharmacist

## 2015-08-08 DIAGNOSIS — Z79899 Other long term (current) drug therapy: Secondary | ICD-10-CM

## 2015-08-08 DIAGNOSIS — Z719 Counseling, unspecified: Secondary | ICD-10-CM

## 2015-08-08 NOTE — Progress Notes (Signed)
Medication Samples have been provided to the patient.  Drug: Asmanex Strength: 100 mcg Qty: 2 inhalers  The patient has been instructed regarding the correct time, dose, administration technique, and frequency of taking this medication, including desired effects and most common side effects.   Samples signed for by Dr. Mannie Stabile.  Pt has been taking Pulmicort (1 puff BID) but has had to use her rescue inhaler much more frequently in the past month. Pt will try Asmanex until sample runs out to see if control is improved.   Felicia Ramirez 12:53 PM 08/08/2015

## 2015-08-09 NOTE — Progress Notes (Signed)
Patient was seen in clinic with Joyce Jih, PharmD candidate. I agree with the assessment and plan of care documented. 

## 2015-08-30 MED ORDER — MONTELUKAST SODIUM 10 MG PO TABS: 10.0000 mg | ORAL_TABLET | Freq: Every day | ORAL | Status: DC

## 2015-08-30 MED ORDER — DULOXETINE HCL 60 MG PO CPEP: 60.0000 mg | ORAL_CAPSULE | Freq: Every day | ORAL | Status: DC

## 2015-08-30 NOTE — Telephone Encounter (Signed)
Spoke w/ pt, she states she will make contact w/ [facility] tomorrow

## 2015-08-30 NOTE — Telephone Encounter (Signed)
Called to [facility], they have not made contact w/ pt as of yet, will try to call her

## 2015-10-10 NOTE — Addendum Note (Signed)
Addended by: Norman Herrlich on: 10/10/2015 01:03 PM   Modules accepted: Orders

## 2015-10-17 ENCOUNTER — Encounter: Payer: Self-pay | Admitting: Internal Medicine

## 2016-01-08 ENCOUNTER — Encounter: Payer: Self-pay | Admitting: Obstetrics and Gynecology

## 2016-01-31 ENCOUNTER — Telehealth: Payer: Self-pay

## 2016-01-31 NOTE — Telephone Encounter (Signed)
LMTCB, TIME TO RENEW [facility] CARD OR APPLY FOR [employer] COVERAGE

## 2016-02-12 ENCOUNTER — Ambulatory Visit: Payer: Self-pay

## 2016-04-17 ENCOUNTER — Ambulatory Visit: Payer: PRIVATE HEALTH INSURANCE | Admitting: Obstetrics and Gynecology

## 2016-04-18 NOTE — Progress Notes (Deleted)
56 y.o. G1P1001 Single African American female here for annual exam.    PCP:     No LMP recorded. Patient is not currently having periods (Reason: Perimenopausal).           Sexually active: {yes no:314532}  The current method of family planning is post menopausal status.    Exercising: {yes [id number]  {types:19826} Smoker:  Former  Health Maintenance: Pap: 03-15-15 Neg:Neg HR HPV  History of abnormal Pap:  no MMG:  12-14-14 Density B/Poss.Lt.Br.mass--further imaging rec./12-21-14 Diag.Lt.mmg and Lt.U/S revealed a normal intramammary lymph node at 12 o'clock in Lt.Br. correlating to mass seen in upper Lt.Br.on mmg. At 7 o'clock in Lt.Br. There is a focally dilated duct correlating to medial mass seen on mmg/Benign:[facility]  Colonoscopy: 05-05-14 benign polyps with Dr. Lenise Herald due 05/2024.  BMD:  n/a  Result  n/a TDaP: 12-08-13  Gardasil:   N/A HIV: Hep C: Screening Labs:  Hb today: ***, Urine today: ***   reports that she quit smoking about 15 years ago. Her smoking use included Cigarettes. She has a 2.75 pack-year smoking history. She has never used smokeless tobacco. She reports that she drinks about 0.6 oz of alcohol per week . She reports that she does not use drugs.  Past Medical History:  Diagnosis Date  . ALLERGIC RHINITIS   . Asthma    cough variant  . Diabetes mellitus without complication ([place])   . Fibroid   . Former smoker, stopped smoking in distant past    quit 2001  . History of anemia   . HSV-2 (herpes simplex virus 2) infection    + Bloodwork  . Neck pain     Past Surgical History:  Procedure Laterality Date  . NO PAST SURGERIES      Current Outpatient Prescriptions  Medication Sig Dispense Refill  . albuterol (VENTOLIN HFA) 108 (90 BASE) MCG/ACT inhaler Inhale 2 puffs into the lungs every 6 (six) hours as needed. Pt takes as needed     . budesonide (PULMICORT FLEXHALER) 180 MCG/ACT inhaler Inhale 1 puff into the lungs 2 (two) times daily. 1 each 5   . cetirizine (ZYRTEC) 10 MG chewable tablet Chew 1 tablet (10 mg total) by mouth daily. 30 tablet 11  . cyclobenzaprine (FLEXERIL) 5 MG tablet Take 5 mg by mouth as needed for muscle spasms.    . DULoxetine (CYMBALTA) 60 MG capsule Take 1 capsule (60 mg total) by mouth daily. 30 capsule 0  . Fe Fum-FePoly-FA-Vit C-Vit B3 (INTEGRA F PO) Take by mouth. Pt taking only when on cycle     . ibuprofen (ADVIL,MOTRIN) 800 MG tablet Take 800 mg by mouth every 8 (eight) hours as needed.      Marland Kitchen ketoconazole (NIZORAL) 2 % cream Apply 1 application topically daily. X 2 weeks 15 g 0  . montelukast (SINGULAIR) 10 MG tablet Take 1 tablet (10 mg total) by mouth daily. 30 tablet 0  . Multiple Vitamin (DAILY MULTIVITAMIN PO) Take by mouth once. Pt takes 1 daily     . nystatin (MYCOSTATIN) powder Apply topically 3 (three) times daily. Apply to affected area for up to 7 days 15 g 3   No current facility-administered medications for this visit.     Family History  Problem Relation Age of Onset  . Throat cancer Maternal Grandmother   . Asthma Daughter   . Diabetes Mother   . Hypertension Mother   . Hypertension Sister   . Diabetes Brother   . Hypertension  Brother     ROS:  Pertinent items are noted in HPI.  Otherwise, a comprehensive ROS was negative.  Exam:   There were no vitals taken for this visit.    General appearance: alert, cooperative and appears stated age Head: Normocephalic, without obvious abnormality, atraumatic Neck: no adenopathy, supple, symmetrical, trachea midline and thyroid normal to inspection and palpation Lungs: clear to auscultation bilaterally Breasts: normal appearance, no masses or tenderness, No nipple retraction or dimpling, No nipple discharge or bleeding, No axillary or supraclavicular adenopathy Heart: regular rate and rhythm Abdomen: soft, non-tender; no masses, no organomegaly Extremities: extremities normal, atraumatic, no cyanosis or edema Skin: Skin color, texture,  turgor normal. No rashes or lesions Lymph nodes: Cervical, supraclavicular, and axillary nodes normal. No abnormal inguinal nodes palpated Neurologic: Grossly normal  Pelvic: External genitalia:  no lesions              Urethra:  normal appearing urethra with no masses, tenderness or lesions              Bartholins and Skenes: normal                 Vagina: normal appearing vagina with normal color and discharge, no lesions              Cervix: no lesions              Pap taken: {yes no:314532} Bimanual Exam:  Uterus:  normal size, contour, position, consistency, mobility, non-tender              Adnexa: no mass, fullness, tenderness              Rectal exam: {yes no:314532}.  Confirms.              Anus:  normal sphincter tone, no lesions  Chaperone was present for exam.  Assessment:   Well woman visit with normal exam.   Plan: Mammogram screening discussed. Recommended self breast awareness. Pap and HR HPV as above. Guidelines for Calcium, Vitamin D, regular exercise program including cardiovascular and weight bearing exercise.   Follow up annually and prn.   Additional counseling given.  {yes [id number]. _______ minutes face to face time of which over 50% was spent in counseling.    After visit summary provided.

## 2016-04-21 ENCOUNTER — Ambulatory Visit: Payer: PRIVATE HEALTH INSURANCE | Admitting: Obstetrics and Gynecology

## 2016-04-25 ENCOUNTER — Ambulatory Visit (INDEPENDENT_AMBULATORY_CARE_PROVIDER_SITE_OTHER): Payer: BLUE CROSS/BLUE SHIELD | Admitting: Obstetrics and Gynecology

## 2016-04-25 ENCOUNTER — Ambulatory Visit: Payer: PRIVATE HEALTH INSURANCE | Admitting: Obstetrics and Gynecology

## 2016-04-25 ENCOUNTER — Encounter: Payer: Self-pay | Admitting: Obstetrics and Gynecology

## 2016-04-25 VITALS — BP 112/76 | HR 80 | Resp 18 | Ht 63.0 in | Wt 241.0 lb

## 2016-04-25 DIAGNOSIS — Z113 Encounter for screening for infections with a predominantly sexual mode of transmission: Secondary | ICD-10-CM

## 2016-04-25 DIAGNOSIS — Z0000 Encounter for general adult medical examination without abnormal findings: Secondary | ICD-10-CM

## 2016-04-25 DIAGNOSIS — Z01419 Encounter for gynecological examination (general) (routine) without abnormal findings: Secondary | ICD-10-CM | POA: Diagnosis not present

## 2016-04-25 LAB — POCT URINALYSIS DIPSTICK
Bilirubin, UA: NEGATIVE
Blood, UA: NEGATIVE
Glucose, UA: NEGATIVE
Ketones, UA: NEGATIVE
Nitrite, UA: NEGATIVE
Protein, UA: NEGATIVE
Urobilinogen, UA: NEGATIVE
pH, UA: 5

## 2016-04-25 LAB — CBC
HEMATOCRIT: 34.4 % — AB (ref 35.0–45.0)
Hemoglobin: 11.3 g/dL — ABNORMAL LOW (ref 11.7–15.5)
MCH: 30.3 pg (ref 27.0–33.0)
MCHC: 32.8 g/dL (ref 32.0–36.0)
MCV: 92.2 fL (ref 80.0–100.0)
MPV: 11.4 fL (ref 7.5–12.5)
PLATELETS: 289 10*3/uL (ref 140–400)
RBC: 3.73 MIL/uL — AB (ref 3.80–5.10)
RDW: 14.6 % (ref 11.0–15.0)
WBC: 3.7 10*3/uL — ABNORMAL LOW (ref 3.8–10.8)

## 2016-04-25 LAB — TSH: TSH: 1.07 mIU/L

## 2016-04-25 LAB — HEMOGLOBIN, FINGERSTICK: Hemoglobin, fingerstick: 11.2 g/dL — ABNORMAL LOW (ref 12.0–15.0)

## 2016-04-25 MED ORDER — VALACYCLOVIR HCL 500 MG PO TABS: 500.0000 mg | ORAL_TABLET | Freq: Two times a day (BID) | ORAL | 11 refills | Status: DC

## 2016-04-25 NOTE — Patient Instructions (Signed)

## 2016-04-25 NOTE — Progress Notes (Signed)
56 y.o. G1P1001 Single African American female here for annual exam.    No vaginal bleeding.  No hot flashes.  Alpha 56.1 in 2015.   HSV outbreaks, which can vary.  Can have two per month.   Desires STD testing today.  Just got over gastroenteritis with fever this week.  Fever ended 3 days ago.   Ran out of Metformin one month ago.  Taking her friends Metformin.  Has DM.  Blood sugar was 127 two days ago.   Working at [employer].   PCP:  [facility] in [place].  Has not seen in a while due to lack of insurance.    Patient's last menstrual period was 07/29/2013.           Sexually active: Yes.    The current method of family planning is post menopausal status.    Exercising: Yes.    Walking Smoker:  former  Health Maintenance: Pap:  03/15/15 Pap and HR HPV negative History of abnormal Pap:  no MMG:  12-14-14 density cat.B/poss.mass Left.br.;Left Diag.and U/S--normal intramammary lymph node Lt.br.12 o'clock and Lt.br.at 7 o'clock focally dilated duct/benign:return to screening:[facility]. Changed insurance. Patient will schedule. Colonoscopy:  05-05-14 benign polyps with Dr. Lenise Herald due 05/2024. BMD:  N/A TDaP:  12/08/13 HIV: 03/15/15 neg  Hep C: 03/15/15 neg  Screening Labs:  Hb today: 11.2 , Urine today: WBC=Trace - No Sx   reports that she quit smoking about 15 years ago. Her smoking use included Cigarettes. She has a 2.75 pack-year smoking history. She has never used smokeless tobacco. She reports that she drinks about 0.6 oz of alcohol per week . She reports that she does not use drugs.  Past Medical History:  Diagnosis Date  . ALLERGIC RHINITIS   . Asthma    cough variant  . Diabetes mellitus without complication ([place])   . Fibroid   . Former smoker, stopped smoking in distant past    quit 2001  . History of anemia   . HSV-2 (herpes simplex virus 2) infection    + Bloodwork  . Neck pain     Past Surgical History:  Procedure Laterality Date  . NO  PAST SURGERIES      Current Outpatient Prescriptions  Medication Sig Dispense Refill  . albuterol (VENTOLIN HFA) 108 (90 BASE) MCG/ACT inhaler Inhale 2 puffs into the lungs every 6 (six) hours as needed. Pt takes as needed     . budesonide (PULMICORT FLEXHALER) 180 MCG/ACT inhaler Inhale 1 puff into the lungs 2 (two) times daily. 1 each 5  . cyclobenzaprine (FLEXERIL) 5 MG tablet Take 5 mg by mouth as needed for muscle spasms.    Marland Kitchen ibuprofen (ADVIL,MOTRIN) 800 MG tablet Take 800 mg by mouth every 8 (eight) hours as needed.      . montelukast (SINGULAIR) 10 MG tablet Take 10 mg by mouth at bedtime.    . Multiple Vitamin (DAILY MULTIVITAMIN PO) Take by mouth once. Pt takes 1 daily      No current facility-administered medications for this visit.     Family History  Problem Relation Age of Onset  . Throat cancer Maternal Grandmother   . Diabetes Mother   . Hypertension Mother   . Hypertension Sister   . Diabetes Brother   . Hypertension Brother   . Asthma Daughter     ROS:  Pertinent items are noted in HPI.  Otherwise, a comprehensive ROS was negative.  Exam:   BP 112/76 (BP Location: Right Arm,  Patient Position: Sitting, Cuff Size: Large)   Pulse 80   Resp 18   Ht 5\' 3"  (1.6 m)   Wt 241 lb (109.3 kg)   LMP 07/29/2013   BMI 42.69 kg/m     General appearance: alert, cooperative and appears stated age Head: Normocephalic, without obvious abnormality, atraumatic Neck: no adenopathy, supple, symmetrical, trachea midline and thyroid normal to inspection and palpation Lungs: clear to auscultation bilaterally Breasts: normal appearance, no masses or tenderness, No nipple retraction or dimpling, No nipple discharge or bleeding, No axillary or supraclavicular adenopathy Heart: regular rate and rhythm Abdomen: soft, non-tender; no masses, no organomegaly Extremities: extremities normal, atraumatic, no cyanosis or edema Skin: Skin color, texture, turgor normal. No rashes or  lesions Lymph nodes: Cervical, supraclavicular, and axillary nodes normal. No abnormal inguinal nodes palpated Neurologic: Grossly normal  Pelvic: External genitalia:  no lesions              Urethra:  normal appearing urethra with no masses, tenderness or lesions              Bartholins and Skenes: normal                 Vagina: normal appearing vagina with normal color and discharge, no lesions              Cervix: no lesions              Pap taken: No. Bimanual Exam:  Uterus:  normal size, contour, position, consistency, mobility, non-tender              Adnexa: no mass, fullness, tenderness              Rectal exam: Yes.  .  Confirms.              Anus:  normal sphincter tone, no lesions  Chaperone was present for exam.  Assessment:   Well woman visit with normal exam. Postmenopausal female. Hx fibroids.  Hx HSV. DM.  Obesity.  Plan: Mammogram screening discussed.  We will assist to schedule mammogram at [facility].  Their office is closed at this time of the day.  The office will contact her next week to assist with this. Recommended self breast awareness. Pap and HR HPV as above. Guidelines for Calcium, Vitamin D, regular exercise program including cardiovascular and weight bearing exercise. We discussed the importance of weight loss through diet and exercise to improve her diabetes and reduce risk of future cardiovascular disease. Start Valtrex 500 mg daily.  STD screening. Routine labs.  Patient will schedule appt with her PCP.  Follow up annually and prn.      After visit summary provided.

## 2016-04-26 LAB — STD PANEL
HEP B S AG: NEGATIVE
HIV: NONREACTIVE

## 2016-04-26 LAB — COMPREHENSIVE METABOLIC PANEL
ALK PHOS: 50 U/L (ref 33–130)
ALT: 20 U/L (ref 6–29)
AST: 19 U/L (ref 10–35)
Albumin: 4.1 g/dL (ref 3.6–5.1)
BUN: 15 mg/dL (ref 7–25)
CALCIUM: 9.3 mg/dL (ref 8.6–10.4)
CHLORIDE: 106 mmol/L (ref 98–110)
CO2: 24 mmol/L (ref 20–31)
Creat: 0.92 mg/dL (ref 0.50–1.05)
Glucose, Bld: 100 mg/dL — ABNORMAL HIGH (ref 65–99)
POTASSIUM: 3.7 mmol/L (ref 3.5–5.3)
Sodium: 140 mmol/L (ref 135–146)
TOTAL PROTEIN: 7 g/dL (ref 6.1–8.1)
Total Bilirubin: 0.4 mg/dL (ref 0.2–1.2)

## 2016-04-26 LAB — VITAMIN D 25 HYDROXY (VIT D DEFICIENCY, FRACTURES): Vit D, 25-Hydroxy: 27 ng/mL — ABNORMAL LOW (ref 30–100)

## 2016-04-26 LAB — LIPID PANEL
CHOLESTEROL: 156 mg/dL (ref <200)
HDL: 75 mg/dL (ref >50)
LDL Cholesterol: 70 mg/dL (ref <100)
TRIGLYCERIDES: 56 mg/dL (ref <150)
Total CHOL/HDL Ratio: 2.1 Ratio (ref <5.0)
VLDL: 11 mg/dL (ref <30)

## 2016-04-26 LAB — GC/CHLAMYDIA PROBE AMP
CT PROBE, AMP APTIMA: NOT DETECTED
GC PROBE AMP APTIMA: NOT DETECTED

## 2016-04-26 LAB — HEMOGLOBIN A1C
HEMOGLOBIN A1C: 6.1 % — AB (ref <5.7)
MEAN PLASMA GLUCOSE: 128 mg/dL

## 2016-04-26 LAB — HEPATITIS C ANTIBODY: HCV Ab: NEGATIVE

## 2016-04-27 ENCOUNTER — Telehealth: Payer: Self-pay | Admitting: Obstetrics and Gynecology

## 2016-04-27 ENCOUNTER — Encounter: Payer: Self-pay | Admitting: Obstetrics and Gynecology

## 2016-04-27 DIAGNOSIS — Z1231 Encounter for screening mammogram for malignant neoplasm of breast: Secondary | ICD-10-CM

## 2016-04-27 NOTE — Telephone Encounter (Signed)
Please contact patient to assist her in scheduling her next mammogram at the [facility].  She was previously seen at [facility]. Their office was closed at the end of her office visit on 04/25/16.

## 2016-04-28 ENCOUNTER — Telehealth: Payer: Self-pay | Admitting: Obstetrics and Gynecology

## 2016-04-28 NOTE — Telephone Encounter (Signed)
Patient is returning a call to Kaitlyn. °

## 2016-04-28 NOTE — Telephone Encounter (Signed)
Left message to call [facility] at [phone].  Need to check to see if the patient has appointment date and time preferences.

## 2016-04-28 NOTE — Telephone Encounter (Signed)
Left message to call Estephani Popper at [phone]. 

## 2016-04-28 NOTE — Telephone Encounter (Signed)
Call to the [facility]. Appointment scheduled for screening mammogram on 05/06/2016 at 3 pm with 2:40 pm arrival. Per Judson Roch at the [facility] they will get films from [facility] for comparison. Left detailed message at number provided [phone], okay per ROI. Advised of appointment date and time at the [facility] as seen above. Advised to contact the office with any further questions.  Routing to provider for final review. Patient agreeable to disposition. Will close encounter.

## 2016-04-28 NOTE — Telephone Encounter (Signed)
Patient returned call and prefers to have this appointment scheduled in the afternoon on a Tuesday or Thursday, preferably on a Tuesday.

## 2016-05-06 ENCOUNTER — Ambulatory Visit
Admission: RE | Admit: 2016-05-06 | Discharge: 2016-05-06 | Disposition: A | Discharge status: Home / Self Care | Payer: BLUE CROSS/BLUE SHIELD | Source: Ambulatory Visit | Attending: Obstetrics and Gynecology | Admitting: Obstetrics and Gynecology

## 2016-05-06 DIAGNOSIS — Z1231 Encounter for screening mammogram for malignant neoplasm of breast: Secondary | ICD-10-CM

## 2016-05-07 NOTE — Telephone Encounter (Signed)
Spoke with patient. Patient states that she got messages mixed up and was returning a call from message left regarding scheduling mammogram and realized after calling. Please see telephone note from 04/27/2016 regarding mammogram scheduling. Patient was seen for screening mammogram on 05/06/2016 with the [facility].  Routing to provider for final review. Patient agreeable to disposition. Will close encounter.

## 2016-05-07 NOTE — Telephone Encounter (Signed)
Left message to call Kaitlyn at [phone]. 

## 2016-10-21 ENCOUNTER — Ambulatory Visit (INDEPENDENT_AMBULATORY_CARE_PROVIDER_SITE_OTHER): Payer: BLUE CROSS/BLUE SHIELD | Admitting: Obstetrics and Gynecology

## 2016-10-21 ENCOUNTER — Encounter: Payer: Self-pay | Admitting: Obstetrics and Gynecology

## 2016-10-21 VITALS — BP 130/80 | HR 60 | Temp 97.6°F | Resp 16 | Wt 240.0 lb

## 2016-10-21 DIAGNOSIS — R300 Dysuria: Secondary | ICD-10-CM | POA: Diagnosis not present

## 2016-10-21 DIAGNOSIS — R350 Frequency of micturition: Secondary | ICD-10-CM

## 2016-10-21 LAB — POCT URINALYSIS DIPSTICK
Bilirubin, UA: NEGATIVE
GLUCOSE UA: NEGATIVE
KETONES UA: NEGATIVE
Protein, UA: NEGATIVE
Urobilinogen, UA: NEGATIVE E.U./dL — AB
pH, UA: 5.5 (ref 5.0–8.0)

## 2016-10-21 MED ORDER — SULFAMETHOXAZOLE-TRIMETHOPRIM 800-160 MG PO TABS: 1.0000 | ORAL_TABLET | Freq: Two times a day (BID) | ORAL | 0 refills | Status: DC

## 2016-10-21 MED ORDER — FLUCONAZOLE 150 MG PO TABS: 150.0000 mg | ORAL_TABLET | Freq: Once | ORAL | 0 refills | Status: AC

## 2016-10-21 NOTE — Patient Instructions (Signed)
Urinary Tract Infection, Adult A urinary tract infection (UTI) is an infection of any part of the urinary tract, which includes the kidneys, ureters, bladder, and urethra. These organs make, store, and get rid of urine in the body. UTI can be a bladder infection (cystitis) or kidney infection (pyelonephritis). What are the causes? This infection may be caused by fungi, viruses, or bacteria. Bacteria are the most common cause of UTIs. This condition can also be caused by repeated incomplete emptying of the bladder during urination. What increases the risk? This condition is more likely to develop if:  You ignore your need to urinate or hold urine for long periods of time.  You do not empty your bladder completely during urination.  You wipe back to front after urinating or having a bowel movement, if you are female.  You are uncircumcised, if you are female.  You are constipated.  You have a urinary catheter that stays in place (indwelling).  You have a weak defense (immune) system.  You have a medical condition that affects your bowels, kidneys, or bladder.  You have diabetes.  You take antibiotic medicines frequently or for long periods of time, and the antibiotics no longer work well against certain types of infections (antibiotic resistance).  You take medicines that irritate your urinary tract.  You are exposed to chemicals that irritate your urinary tract.  You are female.  What are the signs or symptoms? Symptoms of this condition include:  Fever.  Frequent urination or passing small amounts of urine frequently.  Needing to urinate urgently.  Pain or burning with urination.  Urine that smells bad or unusual.  Cloudy urine.  Pain in the lower abdomen or back.  Trouble urinating.  Blood in the urine.  Vomiting or being less hungry than normal.  Diarrhea or abdominal pain.  Vaginal discharge, if you are female.  How is this diagnosed? This condition is  diagnosed with a medical history and physical exam. You will also need to provide a urine sample to test your urine. Other tests may be done, including:  Blood tests.  Sexually transmitted disease (STD) testing.  If you have had more than one UTI, a cystoscopy or imaging studies may be done to determine the cause of the infections. How is this treated? Treatment for this condition often includes a combination of two or more of the following:  Antibiotic medicine.  Other medicines to treat less common causes of UTI.  Over-the-counter medicines to treat pain.  Drinking enough water to stay hydrated.  Follow these instructions at home:  Take over-the-counter and prescription medicines only as told by your health care provider.  If you were prescribed an antibiotic, take it as told by your health care provider. Do not stop taking the antibiotic even if you start to feel better.  Avoid alcohol, caffeine, tea, and carbonated beverages. They can irritate your bladder.  Drink enough fluid to keep your urine clear or pale yellow.  Keep all follow-up visits as told by your health care provider. This is important.  Make sure to: ? Empty your bladder often and completely. Do not hold urine for long periods of time. ? Empty your bladder before and after sex. ? Wipe from front to back after a bowel movement if you are female. Use each tissue one time when you wipe. Contact a health care provider if:  You have back pain.  You have a fever.  You feel nauseous or vomit.  Your symptoms do not  get better after 3 days.  Your symptoms go away and then return. Get help right away if:  You have severe back pain or lower abdominal pain.  You are vomiting and cannot keep down any medicines or water. This information is not intended to replace advice given to you by your health care provider. Make sure you discuss any questions you have with your health care provider. Document Released:  12/25/2004 Document Revised: 08/29/2015 Document Reviewed: 02/05/2015 Elsevier Interactive Patient Education  2017 [place] may take AZO standard for urinary pain for the next 24 hours.  Take one tablet by mouth three times a day as needed.  This is a nonprescription medication.

## 2016-10-21 NOTE — Progress Notes (Signed)
GYNECOLOGY  VISIT   HPI: 56 y.o.   Single  African American  female   [id number] with Patient's last menstrual period was 07/29/2013.   here c/o dysuria X 3 days.  Urinary frequency, urgency and pain with urination.  Also c/o bilateral lower back pain.  Tried cranberry juice and hydration which helped with the burning.  Also took ibuprofen. No nausea or vomiting.  Temp 97.7 two days ago.  Last UTI was age 43 yo.  Recent intercourse was one week before.  No change in partner.   Has DM - stopped taking her Metformin 2 months.   Urine - 1+ RBCs, 2+ WBCs, nitrites, and strong odor.   PCP - Dr. Leslie Andrea.   GYNECOLOGIC HISTORY: Patient's last menstrual period was 07/29/2013. Contraception: postmenopause Menopausal hormone therapy:  None Last mammogram:  05-07-16 WNL Last pap smear:   03-15-15 WNL NEG HR HPV         OB History    Gravida Para Term Preterm AB Living   1 1 1     1    SAB TAB Ectopic Multiple Live Births           1         Patient Active Problem List   Diagnosis Date Noted  . Encounter to establish care with new doctor 07/17/2015  . Diabetes mellitus ([place]) 07/17/2015  . Environmental allergies 07/17/2015  . Candidal skin infection 07/17/2015  . Vitamin D deficiency 07/17/2015  . Leukopenia 07/17/2015  . Fibroids 11/18/2013  . Asthma, cough variant 10/31/2010  . CERVICAL STRAIN 04/02/2010    Past Medical History:  Diagnosis Date  . ALLERGIC RHINITIS   . Asthma    cough variant  . Diabetes mellitus without complication ([place])   . Fibroid   . Former smoker, stopped smoking in distant past    quit 2001  . History of anemia   . HSV-2 (herpes simplex virus 2) infection    + Bloodwork  . Neck pain     Past Surgical History:  Procedure Laterality Date  . NO PAST SURGERIES      Current Outpatient Prescriptions  Medication Sig Dispense Refill  . albuterol (VENTOLIN HFA) 108 (90 BASE) MCG/ACT inhaler Inhale 2 puffs into the lungs every 6 (six) hours as  needed. Pt takes as needed     . budesonide (PULMICORT FLEXHALER) 180 MCG/ACT inhaler Inhale 1 puff into the lungs 2 (two) times daily. 1 each 5  . cyclobenzaprine (FLEXERIL) 5 MG tablet Take 5 mg by mouth as needed for muscle spasms.    Marland Kitchen ibuprofen (ADVIL,MOTRIN) 800 MG tablet Take 800 mg by mouth every 8 (eight) hours as needed.      . montelukast (SINGULAIR) 10 MG tablet Take 10 mg by mouth at bedtime.    . Multiple Vitamin (DAILY MULTIVITAMIN PO) Take by mouth once. Pt takes 1 daily     . valACYclovir (VALTREX) 500 MG tablet Take 1 tablet (500 mg total) by mouth 2 (two) times daily. 30 tablet 11   No current facility-administered medications for this visit.      ALLERGIES: Patient has no known allergies.  Family History  Problem Relation Age of Onset  . Throat cancer Maternal Grandmother   . Diabetes Mother   . Hypertension Mother   . Hypertension Sister   . Diabetes Brother   . Hypertension Brother   . Asthma Daughter     Social History   Social History  . Marital status: Single  Spouse name: N/A  . Number of children: 1  . Years of education: 33   Occupational History  . CNA    Social History Main Topics  . Smoking status: Former Smoker    Packs/day: 0.25    Years: 11.00    Types: Cigarettes    Quit date: 05/20/2000  . Smokeless tobacco: Never Used  . Alcohol use 0.6 oz/week    1 Standard drinks or equivalent per week     Comment: Occasional glass of wine.1 glass every 2-3 weeks  . Drug use: No  . Sexual activity: Yes    Partners: Male    Birth control/ protection: Post-menopausal   Other Topics Concern  . Not on file   Social History Narrative   Lives at home alone.   Right-handed.   Occasional use of caffeine.    ROS:  Pertinent items are noted in HPI.  PHYSICAL EXAMINATION:    BP 130/80 (BP Location: Right Arm, Patient Position: Sitting, Cuff Size: Large)   Pulse 60   Resp 16   Wt 240 lb (108.9 kg)   LMP 07/29/2013   BMI 42.51 kg/m      General appearance: alert, cooperative and appears stated age   Heart: regular rate and rhythm Abdomen: soft, non-tender, no masses,  no organomegaly Back:  No CVA tenderness.  Pelvic:  Deferred.  Chaperone was present for exam.  ASSESSMENT  UTI.  Lower back pain.  Untreated diabetes.   PLAN  Urine micro and culture.  Bactrim DS po bid x 3 days.  Hydrate well.  AZO 100 mg po tid for 24 hours prn.  Return if no improvement.    An After Visit Summary was printed and given to the patient.  __15____ minutes face to face time of which over 50% was spent in counseling.

## 2016-10-22 LAB — URINALYSIS, MICROSCOPIC ONLY
Casts: NONE SEEN /lpf
WBC, UA: >30 /hpf — AB (ref 0–?)

## 2016-10-23 LAB — URINE CULTURE

## 2017-03-12 DIAGNOSIS — J45901 Unspecified asthma with (acute) exacerbation: Secondary | ICD-10-CM | POA: Insufficient documentation

## 2017-04-20 ENCOUNTER — Other Ambulatory Visit: Payer: Self-pay | Admitting: Obstetrics and Gynecology

## 2017-04-20 DIAGNOSIS — Z1231 Encounter for screening mammogram for malignant neoplasm of breast: Secondary | ICD-10-CM

## 2017-05-08 ENCOUNTER — Ambulatory Visit
Admission: RE | Admit: 2017-05-08 | Discharge: 2017-05-08 | Disposition: A | Discharge status: Home / Self Care | Payer: BLUE CROSS/BLUE SHIELD | Source: Ambulatory Visit | Attending: Obstetrics and Gynecology | Admitting: Obstetrics and Gynecology

## 2017-05-08 DIAGNOSIS — Z1231 Encounter for screening mammogram for malignant neoplasm of breast: Secondary | ICD-10-CM

## 2017-12-08 DIAGNOSIS — E119 Type 2 diabetes mellitus without complications: Secondary | ICD-10-CM | POA: Insufficient documentation

## 2018-06-10 ENCOUNTER — Other Ambulatory Visit: Payer: Self-pay | Admitting: Internal Medicine

## 2018-06-10 DIAGNOSIS — Z1231 Encounter for screening mammogram for malignant neoplasm of breast: Secondary | ICD-10-CM

## 2018-07-13 ENCOUNTER — Ambulatory Visit: Payer: BLUE CROSS/BLUE SHIELD

## 2018-09-01 ENCOUNTER — Ambulatory Visit: Payer: BLUE CROSS/BLUE SHIELD

## 2018-10-08 ENCOUNTER — Ambulatory Visit
Admission: RE | Admit: 2018-10-08 | Discharge: 2018-10-08 | Disposition: A | Discharge status: Home / Self Care | Payer: BLUE CROSS/BLUE SHIELD | Source: Ambulatory Visit | Attending: Internal Medicine | Admitting: Internal Medicine

## 2018-10-08 ENCOUNTER — Other Ambulatory Visit: Payer: Self-pay

## 2018-10-08 DIAGNOSIS — Z1231 Encounter for screening mammogram for malignant neoplasm of breast: Secondary | ICD-10-CM

## 2018-12-13 DIAGNOSIS — E538 Deficiency of other specified B group vitamins: Secondary | ICD-10-CM | POA: Insufficient documentation

## 2018-12-13 DIAGNOSIS — D649 Anemia, unspecified: Secondary | ICD-10-CM | POA: Insufficient documentation

## 2019-11-29 ENCOUNTER — Encounter: Payer: Self-pay | Admitting: Nurse Practitioner

## 2019-11-29 ENCOUNTER — Ambulatory Visit: Payer: BC Managed Care – PPO | Admitting: Nurse Practitioner

## 2019-11-29 ENCOUNTER — Other Ambulatory Visit: Payer: Self-pay

## 2019-11-29 VITALS — BP 120/84 | HR 60 | Temp 98.1°F | Wt 197.4 lb

## 2019-11-29 DIAGNOSIS — R1032 Left lower quadrant pain: Secondary | ICD-10-CM | POA: Diagnosis not present

## 2019-11-29 DIAGNOSIS — E119 Type 2 diabetes mellitus without complications: Secondary | ICD-10-CM

## 2019-11-29 DIAGNOSIS — E559 Vitamin D deficiency, unspecified: Secondary | ICD-10-CM

## 2019-11-29 DIAGNOSIS — Z7689 Persons encountering health services in other specified circumstances: Secondary | ICD-10-CM

## 2019-11-29 DIAGNOSIS — R5383 Other fatigue: Secondary | ICD-10-CM | POA: Diagnosis not present

## 2019-11-29 LAB — POCT URINALYSIS DIPSTICK
Bilirubin, UA: NEGATIVE
Blood, UA: NEGATIVE
Glucose, UA: NEGATIVE
Ketones, UA: NEGATIVE
Leukocytes, UA: NEGATIVE
Nitrite, UA: NEGATIVE
Protein, UA: NEGATIVE
Spec Grav, UA: >=1.03 — AB (ref 1.010–1.025)
Urobilinogen, UA: NEGATIVE E.U./dL — AB
pH, UA: 5.5 (ref 5.0–8.0)

## 2019-11-29 NOTE — Progress Notes (Signed)
I,Yamilka Roman [employer] as a Education administrator for [employer], FNP.,have documented all relevant documentation on the behalf of Minette Brine, FNP,as directed by  Minette Brine, FNP while in the presence of Minette Brine, [place].  This visit occurred during the SARS-CoV-2 public health emergency.  Safety protocols were in place, including screening questions prior to the visit, additional usage of staff PPE, and extensive cleaning of exam room while observing appropriate contact time as indicated for disinfecting solutions.  Subjective:     Patient ID: Felicia Ramirez , female    DOB: 12/16/1960 , 58 y.o.   MRN: [id number]   Chief Complaint  Patient presents with  . Establish Care  . Abdominal Pain    patient stated she noticied she has been having some pain in her left lower abdomen that started about 3 months ago     HPI  Here to re-establish care she had been here about 6-7 years ago.   She had been seeing Dr. Ashok Croon in [facility].  Last seen January 2021, she lives in [place] and did not want to keep driving to [employer].  She works as a Quarry manager. Single.  She has one child - healthy.    PMH - diabetes - has been a diabetic for 7 years. She is taking metformin daily. She thinks the highest her HgbA1c has been 6.0. Fibroid (does not have an OB/GYN) - has not been getting larger. She was hoping to "pass them" had a total of 12. Has not had a PAP in more than 3 years.  She has her mammograms done at [facility]. Has not seen any specialist.   She reports normally her asthma flares in the winter. When wearing her mask at work for 16 hours she had to remove the mask last week due to her breathing.  She did not use her inhaler.    She had taken Trulicity in the past but stopped taking due to not feeling like it was helping her.      Past Medical History:  Diagnosis Date  . ALLERGIC RHINITIS   . Asthma    cough variant  . Diabetes mellitus without complication ([place])   . Fibroid    . Former smoker, stopped smoking in distant past    quit 2001  . History of anemia   . HSV-2 (herpes simplex virus 2) infection    + Bloodwork  . Neck pain      Family History  Problem Relation Age of Onset  . Throat cancer Maternal Grandmother   . Diabetes Mother   . Hypertension Mother   . Hypertension Sister   . Diabetes Brother   . Hypertension Brother   . Asthma Daughter   . Kidney disease Maternal Grandfather      Current Outpatient Medications:  .  albuterol (VENTOLIN HFA) 108 (90 BASE) MCG/ACT inhaler, Inhale 2 puffs into the lungs every 6 (six) hours as needed. Pt takes as needed , Disp: , Rfl:  .  Cholecalciferol (VITAMIN D3) 50 MCG (2000 UT) capsule, Take 1 capsule by mouth daily., Disp: , Rfl:  .  ipratropium (ATROVENT) 0.03 % nasal spray, Place 2 sprays into both nostrils as needed for rhinitis., Disp: , Rfl:  .  metFORMIN (GLUCOPHAGE) 500 MG tablet, Take 1,000 mg by mouth daily with breakfast., Disp: , Rfl:  .  nystatin (NYSTATIN) powder, Apply 1 application topically 2 (two) times daily., Disp: , Rfl:  .  nystatin-triamcinolone (MYCOLOG II) cream, Apply 1 application  topically 2 (two) times daily., Disp: , Rfl:  .  triamcinolone cream (KENALOG) 0.1 %, Apply 1 application topically 2 (two) times daily., Disp: , Rfl:  .  vitamin B-12 (CYANOCOBALAMIN) 1000 MCG tablet, Take 1,000 mcg by mouth daily., Disp: , Rfl:  .  budesonide (PULMICORT FLEXHALER) 180 MCG/ACT inhaler, Inhale 1 puff into the lungs 2 (two) times daily., Disp: 1 each, Rfl: 5   No Known Allergies   Review of Systems  Constitutional: Negative.   Respiratory: Negative.  Negative for cough and shortness of breath.   Cardiovascular: Negative.  Negative for chest pain, palpitations and leg swelling.  Gastrointestinal: Negative for abdominal distention, constipation, diarrhea and nausea.       Pain to left groin area; low left quadrant  Musculoskeletal: Negative.   Neurological: Negative.    Psychiatric/Behavioral: Negative.      Today's Vitals   11/29/19 1056  BP: 120/84  Pulse: 60  Temp: 98.1 F (36.7 C)  TempSrc: Oral  Weight: 197 lb 6.4 oz (89.5 kg)   Body mass index is 34.97 kg/m.   Objective:  Physical Exam Constitutional:      General: She is not in acute distress.    Appearance: Normal appearance. She is well-developed. She is obese.  Cardiovascular:     Rate and Rhythm: Normal rate and regular rhythm.     Pulses: Normal pulses.     Heart sounds: Normal heart sounds. No murmur heard.   Pulmonary:     Effort: Pulmonary effort is normal.     Breath sounds: Normal breath sounds.  Chest:     Chest wall: No tenderness.  Musculoskeletal:        General: Normal range of motion.  Skin:    General: Skin is warm and dry.     Capillary Refill: Capillary refill takes less than 2 seconds.  Neurological:     General: No focal deficit present.     Mental Status: She is alert and oriented to person, place, and time.  Psychiatric:        Mood and Affect: Mood normal.        Behavior: Behavior normal.        Thought Content: Thought content normal.        Judgment: Judgment normal.         Assessment And Plan:     1. Encounter to establish care with new doctor  2. Type 2 diabetes mellitus without complication, without long-term current use of insulin (HCC)  Will check HgbA1c and may need to try Ozempic   Has tried Trulicity in the past without benefit. - Lipid panel - CBC - CMP14+EGFR  3. Vitamin D deficiency  Will check vitamin D level and supplement as needed.     Also encouraged to spend 15 minutes in the sun daily.  - Hemoglobin A1c  4. Fatigue, unspecified type  Will check for metabolic cause  Discussed possible sleep apnea which can cause fatigue - Vitamin B12  5. Left lower quadrant abdominal pain  Urinalysis is normal.  Will check liver functions  No tenderness on palpation - POCT Urinalysis Dipstick (16109)     Patient  was given opportunity to ask questions. Patient verbalized understanding of the plan and was able to repeat key elements of the plan. All questions were answered to their satisfaction.   Teola Bradley, FNP, have reviewed all documentation for this visit. The documentation on 12/25/19 for the exam, diagnosis, procedures, and orders are all accurate  and complete.   THE PATIENT IS ENCOURAGED TO PRACTICE SOCIAL DISTANCING DUE TO THE COVID-19 PANDEMIC.

## 2019-11-30 LAB — CMP14+EGFR
ALT: 21 IU/L (ref 0–32)
AST: 23 IU/L (ref 0–40)
Albumin/Globulin Ratio: 1.4 (ref 1.2–2.2)
Albumin: 4.4 g/dL (ref 3.8–4.9)
Alkaline Phosphatase: 59 IU/L (ref 48–121)
BUN/Creatinine Ratio: 23 (ref 9–23)
BUN: 19 mg/dL (ref 6–24)
Bilirubin Total: 0.2 mg/dL (ref 0.0–1.2)
CO2: 22 mmol/L (ref 20–29)
Calcium: 9.4 mg/dL (ref 8.7–10.2)
Chloride: 102 mmol/L (ref 96–106)
Creatinine, Ser: 0.84 mg/dL (ref 0.57–1.00)
GFR calc Af Amer: 90 mL/min/{1.73_m2} (ref >59)
GFR calc non Af Amer: 78 mL/min/{1.73_m2} (ref >59)
Globulin, Total: 3.1 g/dL (ref 1.5–4.5)
Glucose: 84 mg/dL (ref 65–99)
Potassium: 4.3 mmol/L (ref 3.5–5.2)
Sodium: 141 mmol/L (ref 134–144)
Total Protein: 7.5 g/dL (ref 6.0–8.5)

## 2019-11-30 LAB — CBC
Hematocrit: 36.9 % (ref 34.0–46.6)
Hemoglobin: 12.4 g/dL (ref 11.1–15.9)
MCH: 30.8 pg (ref 26.6–33.0)
MCHC: 33.6 g/dL (ref 31.5–35.7)
MCV: 92 fL (ref 79–97)
Platelets: 282 10*3/uL (ref 150–450)
RBC: 4.03 x10E6/uL (ref 3.77–5.28)
RDW: 13.8 % (ref 11.7–15.4)
WBC: 3.8 10*3/uL (ref 3.4–10.8)

## 2019-11-30 LAB — LIPID PANEL
Chol/HDL Ratio: 2.2 ratio (ref 0.0–4.4)
Cholesterol, Total: 223 mg/dL — ABNORMAL HIGH (ref 100–199)
HDL: 103 mg/dL (ref >39)
LDL Chol Calc (NIH): 113 mg/dL — ABNORMAL HIGH (ref 0–99)
Triglycerides: 41 mg/dL (ref 0–149)
VLDL Cholesterol Cal: 7 mg/dL (ref 5–40)

## 2019-11-30 LAB — HEMOGLOBIN A1C
Est. average glucose Bld gHb Est-mCnc: 123 mg/dL
Hgb A1c MFr Bld: 5.9 % — ABNORMAL HIGH (ref 4.8–5.6)

## 2019-11-30 LAB — VITAMIN B12: Vitamin B-12: 506 pg/mL (ref 232–1245)

## 2019-12-19 ENCOUNTER — Other Ambulatory Visit: Payer: Self-pay | Admitting: Nurse Practitioner

## 2019-12-19 DIAGNOSIS — Z1231 Encounter for screening mammogram for malignant neoplasm of breast: Secondary | ICD-10-CM

## 2019-12-26 ENCOUNTER — Other Ambulatory Visit: Payer: Self-pay

## 2019-12-26 ENCOUNTER — Ambulatory Visit
Admission: RE | Admit: 2019-12-26 | Discharge: 2019-12-26 | Disposition: A | Discharge status: Home / Self Care | Payer: BC Managed Care – PPO | Source: Ambulatory Visit | Attending: Nurse Practitioner | Admitting: Nurse Practitioner

## 2019-12-26 DIAGNOSIS — Z1231 Encounter for screening mammogram for malignant neoplasm of breast: Secondary | ICD-10-CM

## 2020-01-09 ENCOUNTER — Ambulatory Visit: Payer: BC Managed Care – PPO | Admitting: Nurse Practitioner

## 2020-01-12 ENCOUNTER — Other Ambulatory Visit: Payer: Self-pay

## 2020-01-12 ENCOUNTER — Ambulatory Visit: Payer: BC Managed Care – PPO | Admitting: Nurse Practitioner

## 2020-01-12 ENCOUNTER — Encounter: Payer: Self-pay | Admitting: Nurse Practitioner

## 2020-01-12 VITALS — BP 116/74 | HR 64 | Temp 98.1°F | Ht 63.0 in | Wt 205.0 lb

## 2020-01-12 DIAGNOSIS — E119 Type 2 diabetes mellitus without complications: Secondary | ICD-10-CM | POA: Diagnosis not present

## 2020-01-12 LAB — POCT UA - MICROALBUMIN
Albumin/Creatinine Ratio, Urine, POC: 30
Creatinine, POC: 100 mg/dL
Microalbumin Ur, POC: 10 mg/L

## 2020-01-12 NOTE — Progress Notes (Signed)
I,Yamilka Roman [employer] as a Education administrator for [employer], FNP.,have documented all relevant documentation on the behalf of Minette Brine, FNP,as directed by  Minette Brine, FNP while in the presence of Minette Brine, [place]. This visit occurred during the SARS-CoV-2 public health emergency.  Safety protocols were in place, including screening questions prior to the visit, additional usage of staff PPE, and extensive cleaning of exam room while observing appropriate contact time as indicated for disinfecting solutions.  Subjective:     Patient ID: Felicia Ramirez , female    DOB: 1961-01-23 , 59 y.o.   MRN: [id number]   Chief Complaint  Patient presents with  . Diabetes    HPI  Patient here fo a dm recheck.  Wt Readings from Last 3 Encounters: 01/12/20 : 205 lb (93 kg) 11/29/19 : 197 lb 6.4 oz (89.5 kg) 10/21/16 : 240 lb (108.9 kg)  Diabetes She presents for her follow-up diabetic visit. Her disease course has been stable. Pertinent negatives for hypoglycemia include no dizziness or headaches. There are no diabetic associated symptoms. Pertinent negatives for diabetes include no chest pain. Symptoms are stable.     Past Medical History:  Diagnosis Date  . ALLERGIC RHINITIS   . Asthma    cough variant  . Diabetes mellitus without complication ([place])   . Fibroid   . Former smoker, stopped smoking in distant past    quit 2001  . History of anemia   . HSV-2 (herpes simplex virus 2) infection    + Bloodwork  . Neck pain      Family History  Problem Relation Age of Onset  . Throat cancer Maternal Grandmother   . Diabetes Mother   . Hypertension Mother   . Hypertension Sister   . Diabetes Brother   . Hypertension Brother   . Asthma Daughter   . Kidney disease Maternal Grandfather      Current Outpatient Medications:  .  albuterol (VENTOLIN HFA) 108 (90 BASE) MCG/ACT inhaler, Inhale 2 puffs into the lungs every 6 (six) hours as needed. Pt takes as needed , Disp: ,  Rfl:  .  Cholecalciferol (VITAMIN D3) 50 MCG (2000 UT) capsule, Take 1 capsule by mouth daily., Disp: , Rfl:  .  ipratropium (ATROVENT) 0.03 % nasal spray, Place 2 sprays into both nostrils as needed for rhinitis., Disp: , Rfl:  .  metFORMIN (GLUCOPHAGE) 500 MG tablet, Take 1,000 mg by mouth daily with breakfast., Disp: , Rfl:  .  nystatin (NYSTATIN) powder, Apply 1 application topically 2 (two) times daily., Disp: , Rfl:  .  nystatin-triamcinolone (MYCOLOG II) cream, Apply 1 application topically 2 (two) times daily., Disp: , Rfl:  .  triamcinolone cream (KENALOG) 0.1 %, Apply 1 application topically 2 (two) times daily., Disp: , Rfl:  .  vitamin B-12 (CYANOCOBALAMIN) 1000 MCG tablet, Take 1,000 mcg by mouth daily., Disp: , Rfl:  .  budesonide (PULMICORT FLEXHALER) 180 MCG/ACT inhaler, Inhale 1 puff into the lungs 2 (two) times daily. (Patient not taking: Reported on 01/12/2020), Disp: 1 each, Rfl: 5   No Known Allergies   Review of Systems  Constitutional: Negative.   Respiratory: Negative.   Cardiovascular: Negative.  Negative for chest pain, palpitations and leg swelling.  Neurological: Negative for dizziness and headaches.  Psychiatric/Behavioral: Negative.      Today's Vitals   01/12/20 1638  BP: 116/74  Pulse: 64  Temp: 98.1 F (36.7 C)  TempSrc: Oral  Weight: 205 lb (93 kg)  Height: 5\' 3"  (  1.6 m)  PainSc: 0-No pain   Body mass index is 36.31 kg/m.   Objective:  Physical Exam Constitutional:      Appearance: Normal appearance.  Cardiovascular:     Rate and Rhythm: Normal rate and regular rhythm.     Pulses: Normal pulses.     Heart sounds: Normal heart sounds. No murmur heard.   Pulmonary:     Effort: Pulmonary effort is normal. No respiratory distress.     Breath sounds: Normal breath sounds.  Neurological:     General: No focal deficit present.     Mental Status: She is alert and oriented to person, place, and time.     Cranial Nerves: No cranial nerve  deficit.  Psychiatric:        Mood and Affect: Mood normal.        Behavior: Behavior normal.        Thought Content: Thought content normal.        Judgment: Judgment normal.         Assessment And Plan:     1. Type 2 diabetes mellitus without complication, without long-term current use of insulin (HCC)  She is willing to start ozempic,   Will start Ozempic 0.25 mg for 4 weeks then increase to 0.5 mg for 2 weeks the initial pen will last for 6 weeks.    Discussed side effects to include nausea, constipation difficulty with swallowing or abdomen pain  Will return in 8 weeks for medication follow up and recheck HgbA1c - POCT UA - Microalbumin     Patient was given opportunity to ask questions. Patient verbalized understanding of the plan and was able to repeat key elements of the plan. All questions were answered to their satisfaction.    Teola Bradley, FNP, have reviewed all documentation for this visit. The documentation on 01/13/20 for the exam, diagnosis, procedures, and orders are all accurate and complete.  THE PATIENT IS ENCOURAGED TO PRACTICE SOCIAL DISTANCING DUE TO THE COVID-19 PANDEMIC.

## 2020-01-13 ENCOUNTER — Encounter: Payer: Self-pay | Admitting: Nurse Practitioner

## 2020-03-08 ENCOUNTER — Encounter: Payer: Self-pay | Admitting: Nurse Practitioner

## 2020-03-08 ENCOUNTER — Other Ambulatory Visit: Payer: Self-pay

## 2020-03-08 ENCOUNTER — Ambulatory Visit: Payer: BC Managed Care – PPO | Admitting: Nurse Practitioner

## 2020-03-08 VITALS — BP 118/64 | HR 66 | Temp 97.9°F | Ht 63.4 in | Wt 202.0 lb

## 2020-03-08 DIAGNOSIS — E119 Type 2 diabetes mellitus without complications: Secondary | ICD-10-CM

## 2020-03-08 MED ORDER — OZEMPIC (0.25 OR 0.5 MG/DOSE) 2 MG/1.5ML ~~LOC~~ SOPN: 0.5000 mg | PEN_INJECTOR | SUBCUTANEOUS | 1 refills | Status: DC

## 2020-03-08 NOTE — Progress Notes (Signed)
This visit occurred during the SARS-CoV-2 public health emergency.  Safety protocols were in place, including screening questions prior to the visit, additional usage of staff PPE, and extensive cleaning of exam room while observing appropriate contact time as indicated for disinfecting solutions.  Subjective:     Patient ID: Felicia Ramirez , female    DOB: [birth year] , 59 y.o.   MRN: [id number]   Chief Complaint  Patient presents with  . Diabetes    HPI  Patient here fo a dm recheck.    Wt Readings from Last 3 Encounters: 03/08/20 : 202 lb (91.6 kg) 01/12/20 : 205 lb (93 kg) 11/29/19 : 197 lb 6.4 oz (89.5 kg)  She was on Ozempic she did well, she can tell it changed her diet and appetite. She did drink some water prior to coming to the office. She reports her weight was down to 194 lbs last week.  She is scheduled for Friday Feb 25th [facility].    She had her last PAP done about 3 years ago with Dr. Cathie Olden.  She would like to have her PAPs done here at the office.   Diabetes She presents for her follow-up diabetic visit. Diabetes type: prediabetes. Her disease course has been stable. Pertinent negatives for hypoglycemia include no dizziness or headaches. There are no diabetic associated symptoms. Pertinent negatives for diabetes include no chest pain. There are no hypoglycemic complications. Symptoms are stable. There are no diabetic complications. Risk factors for coronary artery disease include obesity and sedentary lifestyle. Current diabetic treatment includes oral agent (dual therapy).     Past Medical History:  Diagnosis Date  . ALLERGIC RHINITIS   . Asthma    cough variant  . Diabetes mellitus without complication ([place])   . Fibroid   . Former smoker, stopped smoking in distant past    quit 2001  . History of anemia   . HSV-2 (herpes simplex virus 2) infection    + Bloodwork  . Neck pain      Family History  Problem Relation  Age of Onset  . Throat cancer Maternal Grandmother   . Diabetes Mother   . Hypertension Mother   . Hypertension Sister   . Diabetes Brother   . Hypertension Brother   . Asthma Daughter   . Kidney disease Maternal Grandfather      Current Outpatient Medications:  .  albuterol (VENTOLIN HFA) 108 (90 Base) MCG/ACT inhaler, Inhale 2 puffs into the lungs every 6 (six) hours as needed. Pt takes as needed, Disp: , Rfl:  .  budesonide (PULMICORT FLEXHALER) 180 MCG/ACT inhaler, Inhale 1 puff into the lungs 2 (two) times daily., Disp: 1 each, Rfl: 5 .  Cholecalciferol (VITAMIN D3) 50 MCG (2000 UT) capsule, Take 1 capsule by mouth daily., Disp: , Rfl:  .  ipratropium (ATROVENT) 0.03 % nasal spray, Place 2 sprays into both nostrils as needed for rhinitis., Disp: , Rfl:  .  metFORMIN (GLUCOPHAGE) 500 MG tablet, Take 1,000 mg by mouth daily with breakfast., Disp: , Rfl:  .  nystatin (MYCOSTATIN/NYSTOP) powder, Apply 1 application topically 2 (two) times daily., Disp: , Rfl:  .  nystatin-triamcinolone (MYCOLOG II) cream, Apply 1 application topically 2 (two) times daily., Disp: , Rfl:  .  vitamin B-12 (CYANOCOBALAMIN) 1000 MCG tablet, Take 1,000 mcg by mouth daily., Disp: , Rfl:  .  Semaglutide,0.25 or 0.5MG /DOS, (OZEMPIC, 0.25 OR 0.5 MG/DOSE,) 2 MG/1.5ML SOPN, Inject 0.5 mg into the skin once  a week., Disp: 4.5 mL, Rfl: 1 .  triamcinolone cream (KENALOG) 0.1 %, Apply 1 application topically 2 (two) times daily. (Patient not taking: Reported on 03/08/2020), Disp: , Rfl:    No Known Allergies   Review of Systems  Constitutional: Negative.   Respiratory: Negative.   Cardiovascular: Negative for chest pain, palpitations and leg swelling.  Neurological: Negative for dizziness and headaches.  Psychiatric/Behavioral: Negative.      Today's Vitals   03/08/20 1547  BP: 118/64  Pulse: 66  Temp: 97.9 F (36.6 C)  TempSrc: Oral  Weight: 202 lb (91.6 kg)  Height: 5' 3.4" (1.61 m)  PainSc: 0-No pain    Body mass index is 35.33 kg/m.   Objective:  Physical Exam Vitals reviewed.  Constitutional:      General: She is not in acute distress.    Appearance: Normal appearance. She is obese.  Cardiovascular:     Rate and Rhythm: Normal rate and regular rhythm.     Pulses: Normal pulses.     Heart sounds: Normal heart sounds. No murmur heard.   Pulmonary:     Effort: Pulmonary effort is normal. No respiratory distress.     Breath sounds: Normal breath sounds. No wheezing.  Skin:    General: Skin is warm and dry.     Capillary Refill: Capillary refill takes less than 2 seconds.     Coloration: Skin is not jaundiced.  Neurological:     General: No focal deficit present.     Mental Status: She is alert and oriented to person, place, and time.     Cranial Nerves: No cranial nerve deficit.  Psychiatric:        Mood and Affect: Mood normal.        Behavior: Behavior normal.        Thought Content: Thought content normal.        Judgment: Judgment normal.         Assessment And Plan:     1. Type 2 diabetes mellitus without complication, without long-term current use of insulin (HCC)  Chronic, will restart ozempic at 0.25 mg weekly sample given  She has taken before however reminded her of the side effects of nausea, stomach pain or difficulty with swallowing. No family history of medullary thyroid cancer - Semaglutide,0.25 or 0.5MG /DOS, (OZEMPIC, 0.25 OR 0.5 MG/DOSE,) 2 MG/1.5ML SOPN; Inject 0.5 mg into the skin once a week.  Dispense: 4.5 mL; Refill: 1     Patient was given opportunity to ask questions. Patient verbalized understanding of the plan and was able to repeat key elements of the plan. All questions were answered to their satisfaction.   Teola Bradley, FNP, have reviewed all documentation for this visit. The documentation on 03/12/20 for the exam, diagnosis, procedures, and orders are all accurate and complete.  THE PATIENT IS ENCOURAGED TO PRACTICE SOCIAL  DISTANCING DUE TO THE COVID-19 PANDEMIC.

## 2020-03-08 NOTE — Patient Instructions (Signed)

## 2020-06-04 ENCOUNTER — Encounter: Payer: BC Managed Care – PPO | Admitting: Nurse Practitioner

## 2020-12-10 ENCOUNTER — Other Ambulatory Visit: Payer: Self-pay | Admitting: Nurse Practitioner

## 2020-12-10 DIAGNOSIS — Z1231 Encounter for screening mammogram for malignant neoplasm of breast: Secondary | ICD-10-CM

## 2021-01-14 ENCOUNTER — Other Ambulatory Visit: Payer: Self-pay

## 2021-01-14 ENCOUNTER — Ambulatory Visit: Payer: Managed Care, Other (non HMO) | Admitting: Physician Assistant

## 2021-01-14 ENCOUNTER — Encounter: Payer: Self-pay | Admitting: Physician Assistant

## 2021-01-14 VITALS — BP 120/78 | HR 52 | Temp 97.9°F | Ht 63.0 in | Wt 208.0 lb

## 2021-01-14 DIAGNOSIS — J4540 Moderate persistent asthma, uncomplicated: Secondary | ICD-10-CM

## 2021-01-14 DIAGNOSIS — R1032 Left lower quadrant pain: Secondary | ICD-10-CM

## 2021-01-14 DIAGNOSIS — K625 Hemorrhage of anus and rectum: Secondary | ICD-10-CM | POA: Diagnosis not present

## 2021-01-14 DIAGNOSIS — G8929 Other chronic pain: Secondary | ICD-10-CM

## 2021-01-14 DIAGNOSIS — E119 Type 2 diabetes mellitus without complications: Secondary | ICD-10-CM

## 2021-01-14 LAB — COMPREHENSIVE METABOLIC PANEL
ALT: 9 U/L (ref 0–35)
AST: 14 U/L (ref 0–37)
Albumin: 4.1 g/dL (ref 3.5–5.2)
Alkaline Phosphatase: 49 U/L (ref 39–117)
BUN: 17 mg/dL (ref 6–23)
CO2: 26 mEq/L (ref 19–32)
Calcium: 9.2 mg/dL (ref 8.4–10.5)
Chloride: 108 mEq/L (ref 96–112)
Creatinine, Ser: 0.8 mg/dL (ref 0.40–1.20)
GFR: 81.46 mL/min (ref >60.00)
Glucose, Bld: 100 mg/dL — ABNORMAL HIGH (ref 70–99)
Potassium: 4.4 mEq/L (ref 3.5–5.1)
Sodium: 143 mEq/L (ref 135–145)
Total Bilirubin: 0.3 mg/dL (ref 0.2–1.2)
Total Protein: 6.7 g/dL (ref 6.0–8.3)

## 2021-01-14 LAB — CBC WITH DIFFERENTIAL/PLATELET
Basophils Absolute: 0 10*3/uL (ref 0.0–0.1)
Basophils Relative: 0.7 % (ref 0.0–3.0)
Eosinophils Absolute: 0.1 10*3/uL (ref 0.0–0.7)
Eosinophils Relative: 3.5 % (ref 0.0–5.0)
HCT: 36.9 % (ref 36.0–46.0)
Hemoglobin: 11.9 g/dL — ABNORMAL LOW (ref 12.0–15.0)
Lymphocytes Relative: 28 % (ref 12.0–46.0)
Lymphs Abs: 0.8 10*3/uL (ref 0.7–4.0)
MCHC: 32.4 g/dL (ref 30.0–36.0)
MCV: 95 fl (ref 78.0–100.0)
Monocytes Absolute: 0.3 10*3/uL (ref 0.1–1.0)
Monocytes Relative: 9.6 % (ref 3.0–12.0)
Neutro Abs: 1.6 10*3/uL (ref 1.4–7.7)
Neutrophils Relative %: 58.2 % (ref 43.0–77.0)
Platelets: 189 10*3/uL (ref 150.0–400.0)
RBC: 3.88 Mil/uL (ref 3.87–5.11)
RDW: 14.8 % (ref 11.5–15.5)
WBC: 2.8 10*3/uL — ABNORMAL LOW (ref 4.0–10.5)

## 2021-01-14 LAB — LIPID PANEL
Cholesterol: 206 mg/dL — ABNORMAL HIGH (ref 0–200)
HDL: 88.5 mg/dL (ref >39.00)
LDL Cholesterol: 109 mg/dL — ABNORMAL HIGH (ref 0–99)
NonHDL: 117.07
Total CHOL/HDL Ratio: 2
Triglycerides: 41 mg/dL (ref 0.0–149.0)
VLDL: 8.2 mg/dL (ref 0.0–40.0)

## 2021-01-14 LAB — HEMOGLOBIN A1C: Hgb A1c MFr Bld: 6.1 % (ref 4.6–6.5)

## 2021-01-14 MED ORDER — METFORMIN HCL ER 750 MG PO TB24: 750.0000 mg | ORAL_TABLET | Freq: Every day | ORAL | 1 refills | Status: DC

## 2021-01-14 MED ORDER — OZEMPIC (0.25 OR 0.5 MG/DOSE) 2 MG/1.5ML ~~LOC~~ SOPN: 0.5000 mg | PEN_INJECTOR | SUBCUTANEOUS | 1 refills | Status: DC

## 2021-01-14 MED ORDER — ALBUTEROL SULFATE HFA 108 (90 BASE) MCG/ACT IN AERS: 2.0000 | INHALATION_SPRAY | Freq: Four times a day (QID) | RESPIRATORY_TRACT | 2 refills | Status: DC | PRN

## 2021-01-14 MED ORDER — FLUTICASONE PROPIONATE HFA 44 MCG/ACT IN AERO: 1.0000 | INHALATION_SPRAY | Freq: Two times a day (BID) | RESPIRATORY_TRACT | 5 refills | Status: AC

## 2021-01-14 NOTE — Progress Notes (Signed)
Felicia Ramirez is a 60 y.o. female here to establish care.  History of Present Illness:   Chief Complaint  Patient presents with   Establish Care   Abdominal Pain    Pt c/o LLQ x 3 months. Hx of fibroids    Abdominal Pain (LLQ) Felicia Ramirez presents with c/o intermittent abdominal pain in her left lower quadrant that has been present for three months. Felicia Ramirez does have a hx of fibroids. In 2014 Felicia Ramirez visited Dr. Quincy Simmonds and found that Felicia Ramirez had 12 small uterine fibroids.  In 2016 her colonoscopy results were diverticulosis in sigmoid colon and one diminutive polyp that came back to be non- benign. Per chart patient was due to have repeat colonoscopy in 2021.  Felicia Ramirez reports about 3 weeks after having COVID Felicia Ramirez had bright red blood in her stool. The rectal bleeding continued for one week in July. Denies vaginal bleeding, diarrhea, or constipation.  Felicia Ramirez continues to have ongoing left lower quadrant pain.  Felicia Ramirez denies unintentional weight loss.  Felicia Ramirez denies family history of colon cancer, but her maternal grandmother did have esophageal cancer.   Diabetes Felicia Ramirez is currently non-compliant with taking metformin 1000 mg and ozempic injection 0.5mg  due to medication not being refilled. When Felicia Ramirez was compliant, Felicia Ramirez didn't have any adverse effects. Felicia Ramirez reports that Felicia Ramirez felt good while taking the medication, as if Felicia Ramirez had more energy.   For the past week, Felicia Ramirez reports that her friend had given her some metformin medication and Felicia Ramirez has been taking this daily. At the time Felicia Ramirez was fasting and didn't eat like Felicia Ramirez normally would.   Lab Results  Component Value Date   HGBA1C 5.9 (H) 11/29/2019    Asthma Felicia Ramirez is currently non-compliant with albuterol 108 mcg due to medication not being refilled for the last couple of months. Upon having COVID in June of this year, Felicia Ramirez was experiencing some complications such as phlegm and trouble breathing.   Felicia Ramirez reports that using the albuterol  helped her immensely and Felicia Ramirez hasn't had any problems since.  Felicia Ramirez was prescribed Pulmicort to use regularly as a maintenance inhaler and Felicia Ramirez did well with this.    Health Maintenance: Immunizations -- COVID- Last completed 01/22/20 Therapist, music) Tdap- Lat completed 03/31/05.  Influenza Vaccine- Last completed 01/14/21 Colonoscopy -- Last completed 05/05/14. Mammogram -- Last completed 12/26/19. Next on 01/17/21.  PAP -- Last completed 03/15/15 Bone Density -- N/A Alcohol -- Rare; occassional Tobacco -- Former smoker, cessation since 2002.   Depression screen PHQ 2/9 01/14/2021  Decreased Interest 0  Down, Depressed, Hopeless 0  PHQ - 2 Score 0    No flowsheet data found.   Other providers/specialists: Patient Care Team: [name], [place] as PCP - General (Physician Assistant)   Past Medical History:  Diagnosis Date   ALLERGIC RHINITIS    Asthma    cough variant   Diabetes mellitus without complication ([place])    Fibroid    Former smoker, stopped smoking in distant past    quit 2001   History of anemia    HSV-2 (herpes simplex virus 2) infection    + Bloodwork   Neck pain    Vaginal delivery    1984     Social History   Tobacco Use   Smoking status: Former    Packs/day: 0.25    Years: 11.00    Pack years: 2.75    Types: Cigarettes    Quit date: 05/20/2000    Years since quitting: 20.6  Smokeless tobacco: Never  Vaping Use   Vaping Use: Never used  Substance Use Topics   Alcohol use: Not Currently   Drug use: No    Past Surgical History:  Procedure Laterality Date   NO PAST SURGERIES      Family History  Problem Relation Age of Onset   Throat cancer Maternal Grandmother    Diabetes Mother    Hypertension Mother    Hypertension Sister    Diabetes Brother    Hypertension Brother    Asthma Daughter    Kidney disease Maternal Grandfather     Allergies  Allergen Reactions   Peanut Butter Flavor Hives   Shrimp Extract Allergy Skin Test Other (See  Comments)    Tingling     Current Medications:   Current Outpatient Medications:    Cholecalciferol (VITAMIN D3) 50 MCG (2000 UT) capsule, Take 1 capsule by mouth daily., Disp: , Rfl:    fluticasone (FLOVENT HFA) 44 MCG/ACT inhaler, Inhale 1 puff into the lungs 2 (two) times daily. Inhale 1 puff into the lungs 2 (two) times daily., Disp: 1 each, Rfl: 5   metFORMIN (GLUCOPHAGE XR) 750 MG 24 hr tablet, Take 1 tablet (750 mg total) by mouth daily with breakfast., Disp: 90 tablet, Rfl: 1   Semaglutide,0.25 or 0.5MG /DOS, (OZEMPIC, 0.25 OR 0.5 MG/DOSE,) 2 MG/1.5ML SOPN, Inject 0.5 mg into the skin once a week., Disp: 4.5 mL, Rfl: 1   vitamin B-12 (CYANOCOBALAMIN) 1000 MCG tablet, Take 1,000 mcg by mouth daily., Disp: , Rfl:    albuterol (VENTOLIN HFA) 108 (90 Base) MCG/ACT inhaler, Inhale 2 puffs into the lungs every 6 (six) hours as needed. Pt takes as needed, Disp: 18 g, Rfl: 2   ipratropium (ATROVENT) 0.03 % nasal spray, Place 2 sprays into both nostrils as needed for rhinitis. (Patient not taking: Reported on 01/14/2021), Disp: , Rfl:    triamcinolone cream (KENALOG) 0.1 %, Apply 1 application topically 2 (two) times daily. (Patient not taking: No sig reported), Disp: , Rfl:    Review of Systems:   ROS Negative unless otherwise specified per HPI.  Vitals:   Vitals:   01/14/21 0922  BP: 120/78  Pulse: (!) 52  Temp: 97.9 F (36.6 C)  TempSrc: Temporal  SpO2: 99%  Weight: 208 lb (94.3 kg)  Height: 5\' 3"  (1.6 m)      Body mass index is 36.85 kg/m.  Physical Exam:   Physical Exam Vitals and nursing note reviewed.  Constitutional:      General: Felicia Ramirez is not in acute distress.    Appearance: Felicia Ramirez is well-developed. Felicia Ramirez is not ill-appearing or toxic-appearing.  Cardiovascular:     Rate and Rhythm: Normal rate and regular rhythm.     Pulses: Normal pulses.     Heart sounds: Normal heart sounds, S1 normal and S2 normal.  Pulmonary:     Effort: Pulmonary effort is normal.      Breath sounds: Normal breath sounds.  Abdominal:     Tenderness: There is no abdominal tenderness.  Skin:    General: Skin is warm and dry.  Neurological:     Mental Status: Felicia Ramirez is alert.     GCS: GCS eye subscore is 4. GCS verbal subscore is 5. GCS motor subscore is 6.  Psychiatric:        Speech: Speech normal.        Behavior: Behavior normal. Behavior is cooperative.    Assessment and Plan:   Type 2 diabetes mellitus without complication,  without long-term current use of insulin (HCC) Uncontrolled, update blood work today Restart 750 mg extended release metformin After 1 week, may start 0.25 mg weekly Ozempic Follow-up in 3 months, sooner if concerns  Moderate persistent asthma without complication Uncontrolled Restart maintenance inhaler, will prescribe Flovent to use twice daily Refill albuterol for as needed use Follow-up as needed  Rectal bleeding Denies any current rectal bleeding Urgent referral to gastroenterology for further evaluation of recent episode of rectal bleeding, history of abnormal colonoscopy, and chronic left lower quadrant pain I did ask her to let us know if Felicia Ramirez has any worsening or new symptoms in the interim   I,[name],acting as a scribe for [employer], PA.,have documented all relevant documentation on the behalf of [name], PA,as directed by  [name], PA while in the presence of [name], [place].   I, [name], [place], have reviewed all documentation for this visit. The documentation on 01/14/21 for the exam, diagnosis, procedures, and orders are all accurate and complete.  Time spent with patient today was 45 minutes which consisted of chart review, discussing diagnosis, work up, treatment answering questions and documentation.   [name], PA-C

## 2021-01-14 NOTE — Patient Instructions (Addendum)
It was great to see you!  Update blood work Network engineer metformin 750 mg extended release One week later, restart 0.25 mg ozempic  Restart inhalers Flovent in the AM and PM Albuterol as needed in between  Urgent referral to [facility] placed today- if any worsening abdominal pain or rectal bleeding please let us know ASAP  Let's follow-up in 3 months, sooner if you have concerns.  If a referral was placed today, you will be contacted for an appointment. Please note that routine referrals can sometimes take up to 3-4 weeks to process. Please call our office if you haven't heard anything after this time frame.  Take care,  [name] PA-C

## 2021-01-17 ENCOUNTER — Other Ambulatory Visit: Payer: Self-pay

## 2021-01-17 ENCOUNTER — Ambulatory Visit
Admission: RE | Admit: 2021-01-17 | Discharge: 2021-01-17 | Disposition: A | Discharge status: Home / Self Care | Payer: Managed Care, Other (non HMO) | Source: Ambulatory Visit | Attending: Nurse Practitioner | Admitting: Nurse Practitioner

## 2021-01-17 DIAGNOSIS — Z1231 Encounter for screening mammogram for malignant neoplasm of breast: Secondary | ICD-10-CM

## 2021-01-18 ENCOUNTER — Other Ambulatory Visit: Payer: Self-pay | Admitting: *Deleted

## 2021-01-18 MED ORDER — ATORVASTATIN CALCIUM 20 MG PO TABS
20.0000 mg | ORAL_TABLET | Freq: Every day | ORAL | 1 refills | Status: DC
Start: 2021-01-18 — End: 2021-04-29

## 2021-01-18 NOTE — Progress Notes (Signed)
Rx sent 

## 2021-01-22 ENCOUNTER — Encounter: Payer: Self-pay | Admitting: Gastroenterology

## 2021-02-11 ENCOUNTER — Ambulatory Visit: Payer: Managed Care, Other (non HMO) | Admitting: Gastroenterology

## 2021-02-15 ENCOUNTER — Encounter: Payer: Self-pay | Admitting: Physician Assistant

## 2021-04-03 ENCOUNTER — Encounter: Payer: Self-pay | Admitting: Gastroenterology

## 2021-04-03 ENCOUNTER — Ambulatory Visit (INDEPENDENT_AMBULATORY_CARE_PROVIDER_SITE_OTHER): Payer: Managed Care, Other (non HMO) | Admitting: Gastroenterology

## 2021-04-03 VITALS — BP 120/76 | HR 76 | Ht 63.0 in | Wt 201.0 lb

## 2021-04-03 DIAGNOSIS — K5904 Chronic idiopathic constipation: Secondary | ICD-10-CM | POA: Diagnosis not present

## 2021-04-03 DIAGNOSIS — D649 Anemia, unspecified: Secondary | ICD-10-CM | POA: Diagnosis not present

## 2021-04-03 DIAGNOSIS — K921 Melena: Secondary | ICD-10-CM | POA: Diagnosis not present

## 2021-04-03 NOTE — Patient Instructions (Signed)
You have been scheduled for a colonoscopy. Please follow written instructions given to you at your visit today.  Please pick up your prep supplies at the pharmacy within the next 1-3 days. If you use inhalers (even only as needed), please bring them with you on the day of your procedure.   Take Miralax 1 capful daily  Increase dietary fiber  Increase water intake to 8-10 cups per day  Due to recent changes in healthcare laws, you may see the results of your imaging and laboratory studies on MyChart before your provider has had a chance to review them.  We understand that in some cases there may be results that are confusing or concerning to you. Not all laboratory results come back in the same time frame and the provider may be waiting for multiple results in order to interpret others.  Please give [place] 48 hours in order for your provider to thoroughly review all the results before contacting the office for clarification of your results.    If you are age 30 or older, your body mass index should be between 23-30. Your Body mass index is 35.61 kg/m. If this is out of the aforementioned range listed, please consider follow up with your Primary Care Provider.  If you are age 22 or younger, your body mass index should be between 19-25. Your Body mass index is 35.61 kg/m. If this is out of the aformentioned range listed, please consider follow up with your Primary Care Provider.   ________________________________________________________  The [facility] providers would like to encourage you to use [facility] to communicate with providers for non-urgent requests or questions.  Due to long hold times on the telephone, sending your provider a message by [facility] may be a faster and more efficient way to get a response.  Please allow 48 business hours for a response.  Please remember that this is for non-urgent requests.  _______________________________________________________   I appreciate the  opportunity  to care for you  Thank You   Harl Bowie , MD

## 2021-04-03 NOTE — Progress Notes (Signed)
Felicia Ramirez    [id number]    09-04-1960  Primary Care Physician:Worley, [place], [place]  Referring Physician: [name], [address] [place],  [place] [address]   Chief complaint: Blood in stool, anemia  HPI: 61 year old very pleasant female here for new patient visit with complaints of blood in stool.   She noticed blood in stool, mixed in stool and also in toilet bowl after she had Covid infection in June 2022.  It has since resolved  She had bad cough around that time, was taking lots of pills, thinks it may have been related to all the medications that she was taking at the time  Denies any abdominal pain or melena.  No recent episodes of rectal bleeding Denies any unintentional weight loss or decreased appetite  On review of labs she does have mild anemia, she is postmenopausal CBC Latest Ref Rng & Units 01/14/2021 11/29/2019 04/25/2016  WBC 4.0 - 10.5 K/uL 2.8(L) 3.8 3.7(L)  Hemoglobin 12.0 - 15.0 g/dL 11.9(L) 12.4 11.3(L)  Hematocrit 36.0 - 46.0 % 36.9 36.9 34.4(L)  Platelets 150.0 - 400.0 K/uL 189.0 282 289     Colonoscopy  05/05/2014 by Dr Juanita Craver.  Reports not available but based on pathology report she had a polyp removed from the descending colon that was a hyperplastic polyp   Outpatient Encounter Medications as of 04/03/2021  Medication Sig   albuterol (VENTOLIN HFA) 108 (90 Base) MCG/ACT inhaler Inhale 2 puffs into the lungs every 6 (six) hours as needed. Pt takes as needed   atorvastatin (LIPITOR) 20 MG tablet Take 1 tablet (20 mg total) by mouth daily.   Cholecalciferol (VITAMIN D3) 50 MCG (2000 UT) capsule Take 1 capsule by mouth daily.   fluticasone (FLOVENT HFA) 44 MCG/ACT inhaler Inhale 1 puff into the lungs 2 (two) times daily. Inhale 1 puff into the lungs 2 (two) times daily.   ipratropium (ATROVENT) 0.03 % nasal spray Place 2 sprays into both nostrils as needed for rhinitis.   metFORMIN (GLUCOPHAGE XR) 750 MG 24 hr  tablet Take 1 tablet (750 mg total) by mouth daily with breakfast.   Semaglutide,0.25 or 0.5MG /DOS, (OZEMPIC, 0.25 OR 0.5 MG/DOSE,) 2 MG/1.5ML SOPN Inject 0.5 mg into the skin once a week.   triamcinolone cream (KENALOG) 0.1 % Apply 1 application topically 2 (two) times daily.   vitamin B-12 (CYANOCOBALAMIN) 1000 MCG tablet Take 1,000 mcg by mouth daily.   No facility-administered encounter medications on file as of 04/03/2021.    Allergies as of 04/03/2021 - Review Complete 04/03/2021  Allergen Reaction Noted   Peanut butter flavor Hives 01/14/2021   Shrimp extract allergy skin test Other (See Comments) 03/12/2017   Eggs or egg-derived products Hives 04/03/2021    Past Medical History:  Diagnosis Date   ALLERGIC RHINITIS    Asthma    cough variant   Diabetes mellitus without complication ([place])    Fibroid    Former smoker, stopped smoking in distant past    quit 2001   History of anemia    HSV-2 (herpes simplex virus 2) infection    + Bloodwork   Neck pain    Vaginal delivery    1984    Past Surgical History:  Procedure Laterality Date   NO PAST SURGERIES      Family History  Problem Relation Age of Onset   Throat cancer Maternal Grandmother    Diabetes Mother    Hypertension  Mother    Hypertension Sister    Diabetes Brother    Hypertension Brother    Asthma Daughter    Kidney disease Maternal Grandfather     Social History   Socioeconomic History   Marital status: Single    Spouse name: Not on file   Number of children: 1   Years of education: 12   Highest education level: Not on file  Occupational History   Occupation: CNA  Tobacco Use   Smoking status: Former    Packs/day: 0.25    Years: 11.00    Pack years: 2.75    Types: Cigarettes    Quit date: 05/20/2000    Years since quitting: 20.8   Smokeless tobacco: Never  Vaping Use   Vaping Use: Never used  Substance and Sexual Activity   Alcohol use: Not Currently   Drug use: No   Sexual activity:  Not Currently    Partners: Male    Birth control/protection: Post-menopausal  Other Topics Concern   Not on file  Social History Narrative   Works at [employer], works 4 x 12 hours   Lives alone   Daughter   3 grandchildren   Social Determinants of Radio broadcast assistant Strain: Not on [employer] Insecurity: Not on file  Transportation Needs: Not on file  Physical Activity: Not on file  Stress: Not on file  Social Connections: Not on file  Intimate Partner Violence: Not on file      Review of systems: All other review of systems negative except as mentioned in the HPI.   Physical Exam: Vitals:   04/03/21 0906  BP: 120/76  Pulse: 76   Body mass index is 35.61 kg/m. Gen:      No acute distress HEENT:  sclera anicteric Abd:      soft, non-tender; no palpable masses, no distension Ext:    No edema Neuro: alert and oriented x 3 Psych: normal mood and affect  Data Reviewed:  Reviewed labs, radiology imaging, old records and pertinent past GI work up   Assessment and Plan/Recommendations:  61 year old very pleasant female with history of chronic mild anemia, postmenopausal with episode of blood mixed in stool few months ago. Will need to exclude neoplastic lesion, last colonoscopy was 7 years ago. Will plan to proceed with colonoscopy for further evaluation. The risks and benefits as well as alternatives of endoscopic procedure(s) have been discussed and reviewed. All questions answered. The patient agrees to proceed.  Chronic idiopathic constipation: Increase dietary fiber and water intake Use MiraLAX 1 capful daily, titrate dose based on response to have 1-2 soft bowel movements daily   The patient was provided an opportunity to ask questions and all were answered. The patient agreed with the plan and demonstrated an understanding of the instructions.  [name] , MD    CC: [name], [place]

## 2021-04-04 ENCOUNTER — Encounter: Payer: Self-pay | Admitting: Gastroenterology

## 2021-04-08 ENCOUNTER — Encounter: Payer: Self-pay | Admitting: Gastroenterology

## 2021-04-08 ENCOUNTER — Ambulatory Visit (AMBULATORY_SURGERY_CENTER): Payer: Managed Care, Other (non HMO) | Admitting: Gastroenterology

## 2021-04-08 VITALS — BP 144/85 | HR 61 | Temp 96.4°F | Resp 20 | Ht 63.0 in | Wt 201.0 lb

## 2021-04-08 DIAGNOSIS — K5730 Diverticulosis of large intestine without perforation or abscess without bleeding: Secondary | ICD-10-CM

## 2021-04-08 DIAGNOSIS — K648 Other hemorrhoids: Secondary | ICD-10-CM

## 2021-04-08 DIAGNOSIS — K649 Unspecified hemorrhoids: Secondary | ICD-10-CM | POA: Diagnosis not present

## 2021-04-08 DIAGNOSIS — K921 Melena: Secondary | ICD-10-CM | POA: Diagnosis present

## 2021-04-08 MED ORDER — SODIUM CHLORIDE 0.9 % IV SOLN: 500.0000 mL | Freq: Once | INTRAVENOUS | Status: DC

## 2021-04-08 NOTE — Op Note (Signed)
[facility] Patient Name: Felicia Ramirez Procedure Date: 04/08/2021 3:51 PM MRN: [id number] Endoscopist: Mauri Pole , MD Age: 61 Referring MD:  Date of Birth: [birth year] Gender: Female Account #: [id number] Procedure:                Colonoscopy Indications:              Evaluation of unexplained GI bleeding presenting                            with Hematochezia, Anemia Medicines:                Monitored Anesthesia Care Procedure:                Pre-Anesthesia Assessment:                           - Prior to the procedure, a History and Physical                            was performed, and patient medications and                            allergies were reviewed. The patient's tolerance of                            previous anesthesia was also reviewed. The risks                            and benefits of the procedure and the sedation                            options and risks were discussed with the patient.                            All questions were answered, and informed consent                            was obtained. Prior Anticoagulants: The patient has                            taken no previous anticoagulant or antiplatelet                            agents. ASA Grade Assessment: II - A patient with                            mild systemic disease. After reviewing the risks                            and benefits, the patient was deemed in                            satisfactory condition to undergo the procedure.  After obtaining informed consent, the colonoscope                            was passed under direct vision. Throughout the                            procedure, the patient's blood pressure, pulse, and                            oxygen saturations were monitored continuously. The                            Olympus PCF-H190DL [id number]) Colonoscope was                            introduced through the anus  and advanced to the the                            terminal ileum, with identification of the                            appendiceal orifice and IC valve. The colonoscopy                            was performed without difficulty. The patient                            tolerated the procedure well. The quality of the                            bowel preparation was good. The ileocecal valve,                            appendiceal orifice, and rectum were photographed. Scope In: 4:34:01 PM Scope Out: 7:35:32 PM Scope Withdrawal Time: 0 hours 9 minutes 32 seconds  Total Procedure Duration: 0 hours 15 minutes 17 seconds  Findings:                 The perianal and digital rectal examinations were                            normal.                           Scattered small-mouthed diverticula were found in                            the sigmoid colon and descending colon.                           Non-bleeding external and internal hemorrhoids were                            found during retroflexion. The hemorrhoids were  small.                           The exam was otherwise without abnormality. Complications:            No immediate complications. Estimated Blood Loss:     Estimated blood loss was minimal. Impression:               - Diverticulosis in the sigmoid colon and in the                            descending colon.                           - Non-bleeding external and internal hemorrhoids.                           - The examination was otherwise normal.                           - No specimens collected. Recommendation:           - Patient has a contact number available for                            emergencies. The signs and symptoms of potential                            delayed complications were discussed with the                            patient. Return to normal activities tomorrow.                            Written discharge instructions  were provided to the                            patient.                           - Resume previous diet.                           - Continue present medications.                           - Repeat colonoscopy in 10 years for screening                            purposes. Mauri Pole, MD 04/08/2021 4:53:14 PM This report has been signed electronically.

## 2021-04-08 NOTE — Patient Instructions (Signed)
Recommend next screening colonoscopy in 10 years.  Please contact us if any issues or concerns arise.      YOU HAD AN ENDOSCOPIC PROCEDURE TODAY AT [facility]:   Refer to the procedure report that was given to you for any specific questions about what was found during the examination.  If the procedure report does not answer your questions, please call your gastroenterologist to clarify.  If you requested that your care partner not be given the details of your procedure findings, then the procedure report has been included in a sealed envelope for you to review at your convenience later.  YOU SHOULD EXPECT: Some feelings of bloating in the abdomen. Passage of more gas than usual.  Walking can help get rid of the air that was put into your GI tract during the procedure and reduce the bloating. If you had a lower endoscopy (such as a colonoscopy or flexible sigmoidoscopy) you may notice spotting of blood in your stool or on the toilet paper. If you underwent a bowel prep for your procedure, you may not have a normal bowel movement for a few days.  Please Note:  You might notice some irritation and congestion in your nose or some drainage.  This is from the oxygen used during your procedure.  There is no need for concern and it should clear up in a day or so.  SYMPTOMS TO REPORT IMMEDIATELY:  Following lower endoscopy (colonoscopy or flexible sigmoidoscopy):  Excessive amounts of blood in the stool  Significant tenderness or worsening of abdominal pains  Swelling of the abdomen that is new, acute  Fever of 100F or higher   For urgent or emergent issues, a gastroenterologist can be reached at any hour by calling [phone]. Do not use MyChart messaging for urgent concerns.    DIET:  We do recommend a small meal at first, but then you may proceed to your regular diet.  Drink plenty of fluids but you should avoid alcoholic beverages for 24 hours.  ACTIVITY:  You should plan  to take it easy for the rest of today and you should NOT DRIVE or use heavy machinery until tomorrow (because of the sedation medicines used during the test).    FOLLOW UP: Our staff will call the number listed on your records 48-72 hours following your procedure to check on you and address any questions or concerns that you may have regarding the information given to you following your procedure. If we do not reach you, we will leave a message.  We will attempt to reach you two times.  During this call, we will ask if you have developed any symptoms of COVID 19. If you develop any symptoms (ie: fever, flu-like symptoms, shortness of breath, cough etc.) before then, please call [phone].  If you test positive for Covid 19 in the 2 weeks post procedure, please call and report this information to [place].    If any biopsies were taken you will be contacted by phone or by letter within the next 1-3 weeks.  Please call us at [phone] if you have not heard about the biopsies in 3 weeks.    SIGNATURES/CONFIDENTIALITY: You and/or your care partner have signed paperwork which will be entered into your electronic medical record.  These signatures attest to the fact that that the information above on your After Visit Summary has been reviewed and is understood.  Full responsibility of the confidentiality of this discharge information lies with you  and/or your care-partner.  °

## 2021-04-08 NOTE — Progress Notes (Signed)
Sedate, gd SR, tolerated procedure well, VSS, report to RN 

## 2021-04-08 NOTE — Progress Notes (Signed)
Vitals-DT  Pt's states no medical or surgical changes since previsit or office visit.   Patient has been stuck a total of 8 times.  CRNA Nelda Marseille, speaking to doctor regarding poor veins.Dr Silverio Decamp is present.  She gave the verbal order to use the foot if necessary.  The r hand was successful by Kassie Mends, CRN

## 2021-04-08 NOTE — Progress Notes (Signed)
Please refer to office visit note 04/03/21. No additional changes in H&P Patient is appropriate for planned procedure(s) and anesthesia in an ambulatory setting  K. Denzil Magnuson , MD [phone]

## 2021-04-10 ENCOUNTER — Telehealth: Payer: Self-pay | Admitting: *Deleted

## 2021-04-10 NOTE — Telephone Encounter (Signed)
°  Follow up Call-  Call back number 04/08/2021  Post procedure Call Back phone  # [phone]  Permission to leave phone message Yes  Some recent data might be hidden     Patient questions:  Do you have a fever, pain , or abdominal swelling? No. Pain Score  0 *  Have you tolerated food without any problems? Yes.    Have you been able to return to your normal activities? Yes.    Do you have any questions about your discharge instructions: Diet   No. Medications  No. Follow up visit  No.  Do you have questions or concerns about your Care? No.  Actions: * If pain score is 4 or above: No action needed, pain <4.

## 2021-04-18 NOTE — Progress Notes (Incomplete)
Felicia Ramirez is a 61 y.o. female here for a follow up of Diabetes.   SCRIBE STATEMENT  History of Present Illness:   No chief complaint on file.   HPI Diabetes 3 month follow-up. Currently compliant with taking metformin XR 750 mg daily with no complications.Blood sugars at home are: *** - ***. Denies: hypoglycemic or hyperglycemic episodes or symptoms.   Lab Results  Component Value Date   HGBA1C 6.1 01/14/2021    Past Medical History:  Diagnosis Date   ALLERGIC RHINITIS    Asthma    cough variant   Diabetes mellitus without complication ([place])    Fibroid    Former smoker, stopped smoking in distant past    quit 2001   History of anemia    HSV-2 (herpes simplex virus 2) infection    + Bloodwork   Neck pain    Vaginal delivery    1984     Social History   Tobacco Use   Smoking status: Former    Packs/day: 0.25    Years: 11.00    Pack years: 2.75    Types: Cigarettes    Quit date: 05/20/2000    Years since quitting: 20.9   Smokeless tobacco: Never  Vaping Use   Vaping Use: Never used  Substance Use Topics   Alcohol use: Not Currently   Drug use: No    Past Surgical History:  Procedure Laterality Date   NO PAST SURGERIES      Family History  Problem Relation Age of Onset   Diabetes Mother    Hypertension Mother    Hypertension Sister    Diabetes Brother    Hypertension Brother    Esophageal cancer Maternal Grandmother    Throat cancer Maternal Grandmother    Kidney disease Maternal Grandfather    Asthma Daughter    Colon cancer Neg Hx    Rectal cancer Neg Hx    Stomach cancer Neg Hx     Allergies  Allergen Reactions   Peanut Butter Flavor Hives   Shrimp Extract Allergy Skin Test Other (See Comments)    Tingling   Eggs Or Egg-Derived Products Hives    Current Medications:   Current Outpatient Medications:    albuterol (VENTOLIN HFA) 108 (90 Base) MCG/ACT inhaler, Inhale 2 puffs into the lungs every 6 (six) hours as needed. Pt  takes as needed, Disp: 18 g, Rfl: 2   atorvastatin (LIPITOR) 20 MG tablet, Take 1 tablet (20 mg total) by mouth daily., Disp: 90 tablet, Rfl: 1   Cholecalciferol (VITAMIN D3) 50 MCG (2000 UT) capsule, Take 1 capsule by mouth daily., Disp: , Rfl:    fluticasone (FLOVENT HFA) 44 MCG/ACT inhaler, Inhale 1 puff into the lungs 2 (two) times daily. Inhale 1 puff into the lungs 2 (two) times daily., Disp: 1 each, Rfl: 5   ipratropium (ATROVENT) 0.03 % nasal spray, Place 2 sprays into both nostrils as needed for rhinitis., Disp: , Rfl:    metFORMIN (GLUCOPHAGE XR) 750 MG 24 hr tablet, Take 1 tablet (750 mg total) by mouth daily with breakfast., Disp: 90 tablet, Rfl: 1   Semaglutide,0.25 or 0.5MG /DOS, (OZEMPIC, 0.25 OR 0.5 MG/DOSE,) 2 MG/1.5ML SOPN, Inject 0.5 mg into the skin once a week., Disp: 4.5 mL, Rfl: 1   triamcinolone cream (KENALOG) 0.1 %, Apply 1 application topically 2 (two) times daily., Disp: , Rfl:    vitamin B-12 (CYANOCOBALAMIN) 1000 MCG tablet, Take 1,000 mcg by mouth daily., Disp: , Rfl:  Review of Systems:   ROS Negative unless otherwise specified per HPI. Vitals:   There were no vitals filed for this visit.   There is no height or weight on file to calculate BMI.  Physical Exam:   Physical Exam  Assessment and Plan:  Type 2 diabetes mellitus without complication, without long-term current use of insulin (HCC) ***     I,[name],acting as a scribe for [employer], PA.,have documented all relevant documentation on the behalf of [name], PA,as directed by  [name], PA while in the presence of [name], [place].  ***  [name], PA-C

## 2021-04-19 ENCOUNTER — Ambulatory Visit: Payer: Managed Care, Other (non HMO) | Admitting: Physician Assistant

## 2021-04-29 ENCOUNTER — Encounter: Payer: Self-pay | Admitting: Physician Assistant

## 2021-04-29 ENCOUNTER — Other Ambulatory Visit: Payer: Self-pay

## 2021-04-29 ENCOUNTER — Ambulatory Visit (INDEPENDENT_AMBULATORY_CARE_PROVIDER_SITE_OTHER): Payer: Managed Care, Other (non HMO) | Admitting: Physician Assistant

## 2021-04-29 VITALS — BP 116/72 | HR 65 | Temp 98.0°F | Ht 63.0 in | Wt 203.0 lb

## 2021-04-29 DIAGNOSIS — E785 Hyperlipidemia, unspecified: Secondary | ICD-10-CM

## 2021-04-29 DIAGNOSIS — D72819 Decreased white blood cell count, unspecified: Secondary | ICD-10-CM

## 2021-04-29 DIAGNOSIS — H938X3 Other specified disorders of ear, bilateral: Secondary | ICD-10-CM

## 2021-04-29 DIAGNOSIS — E119 Type 2 diabetes mellitus without complications: Secondary | ICD-10-CM | POA: Diagnosis not present

## 2021-04-29 LAB — CBC WITH DIFFERENTIAL/PLATELET
Basophils Absolute: 0 10*3/uL (ref 0.0–0.1)
Basophils Relative: 0.7 % (ref 0.0–3.0)
Eosinophils Absolute: 0.1 10*3/uL (ref 0.0–0.7)
Eosinophils Relative: 3.9 % (ref 0.0–5.0)
HCT: 34.4 % — ABNORMAL LOW (ref 36.0–46.0)
Hemoglobin: 11.4 g/dL — ABNORMAL LOW (ref 12.0–15.0)
Lymphocytes Relative: 27.6 % (ref 12.0–46.0)
Lymphs Abs: 1 10*3/uL (ref 0.7–4.0)
MCHC: 33 g/dL (ref 30.0–36.0)
MCV: 93.9 fl (ref 78.0–100.0)
Monocytes Absolute: 0.3 10*3/uL (ref 0.1–1.0)
Monocytes Relative: 8.7 % (ref 3.0–12.0)
Neutro Abs: 2.2 10*3/uL (ref 1.4–7.7)
Neutrophils Relative %: 59.1 % (ref 43.0–77.0)
Platelets: 248 10*3/uL (ref 150.0–400.0)
RBC: 3.66 Mil/uL — ABNORMAL LOW (ref 3.87–5.11)
RDW: 15 % (ref 11.5–15.5)
WBC: 3.6 10*3/uL — ABNORMAL LOW (ref 4.0–10.5)

## 2021-04-29 LAB — HEMOGLOBIN A1C: Hgb A1c MFr Bld: 6 % (ref 4.6–6.5)

## 2021-04-29 MED ORDER — PRAVASTATIN SODIUM 20 MG PO TABS: 20.0000 mg | ORAL_TABLET | Freq: Every day | ORAL | 1 refills | Status: DC

## 2021-04-29 NOTE — Patient Instructions (Signed)
It was great to see you!  Please continue your medications as they are listed  I will be in touch with my recommendations  Let's follow-up in 6 months, sooner if you have concerns.  Take care,  [name] PA-C

## 2021-04-29 NOTE — Progress Notes (Signed)
Felicia Ramirez is a 61 y.o. female here for a follow up of a pre-existing problem.  History of Present Illness:   Chief Complaint  Patient presents with   Follow-up    HPI  Diabetes 3 month follow-up. Current DM meds: metformin XR 750 mg daily and Ozempic 0.25 mg weekly injection.  Blood sugars at home are: not checked. Patient is compliant with medications, but has found it difficult to stay as compliant with her ozempic injection due to medication shortage issues. Despite this, she is managing well with these medications. Denies: hypoglycemic or hyperglycemic episodes or symptoms.  Lab Results  Component Value Date   HGBA1C 6.1 01/14/2021    HLD Pt reports that she is no longer compliant with taking lipitor 20 mg daily due to medication causing headaches. At this time she is interested in trialing another medication. Denies CP.   Abnormal Bilateral Ear Sensation Pt states she has been experiencing bilateral ear sensation that she compares to clogged ears. In an effort to manage this, she has tried using q-tips to keep her ears clean, but this has not provided relief. At this time she would like to have her ears evaluated to make sure nothing significant is occurring. Denies hearing loss.   Leukopenia Patient found on prior labs to have low white blood cell count.  She denies any abnormal fatigue, abdominal pain or unintentional weight changes.  She has not had this worked up before.  Past Medical History:  Diagnosis Date   ALLERGIC RHINITIS    Asthma    cough variant   Diabetes mellitus without complication ([place])    Fibroid    Former smoker, stopped smoking in distant past    quit 2001   History of anemia    HSV-2 (herpes simplex virus 2) infection    + Bloodwork   Neck pain    Vaginal delivery    1984     Social History   Tobacco Use   Smoking status: Former    Packs/day: 0.25    Years: 11.00    Pack years: 2.75    Types: Cigarettes    Quit date:  05/20/2000    Years since quitting: 20.9   Smokeless tobacco: Never  Vaping Use   Vaping Use: Never used  Substance Use Topics   Alcohol use: Not Currently   Drug use: No    Past Surgical History:  Procedure Laterality Date   NO PAST SURGERIES      Family History  Problem Relation Age of Onset   Diabetes Mother    Hypertension Mother    Hypertension Sister    Diabetes Brother    Hypertension Brother    Esophageal cancer Maternal Grandmother    Throat cancer Maternal Grandmother    Kidney disease Maternal Grandfather    Asthma Daughter    Colon cancer Neg Hx    Rectal cancer Neg Hx    Stomach cancer Neg Hx     Allergies  Allergen Reactions   Peanut Butter Flavor Hives   Shrimp Extract Allergy Skin Test Other (See Comments)    Tingling   Eggs Or Egg-Derived Products Hives    Current Medications:   Current Outpatient Medications:    albuterol (VENTOLIN HFA) 108 (90 Base) MCG/ACT inhaler, Inhale 2 puffs into the lungs every 6 (six) hours as needed. Pt takes as needed, Disp: 18 g, Rfl: 2   atorvastatin (LIPITOR) 20 MG tablet, Take 1 tablet (20 mg total) by mouth daily., Disp:  90 tablet, Rfl: 1   Cholecalciferol (VITAMIN D3) 50 MCG (2000 UT) capsule, Take 1 capsule by mouth daily., Disp: , Rfl:    fluticasone (FLOVENT HFA) 44 MCG/ACT inhaler, Inhale 1 puff into the lungs 2 (two) times daily. Inhale 1 puff into the lungs 2 (two) times daily., Disp: 1 each, Rfl: 5   ipratropium (ATROVENT) 0.03 % nasal spray, Place 2 sprays into both nostrils as needed for rhinitis., Disp: , Rfl:    metFORMIN (GLUCOPHAGE XR) 750 MG 24 hr tablet, Take 1 tablet (750 mg total) by mouth daily with breakfast., Disp: 90 tablet, Rfl: 1   Semaglutide,0.25 or 0.5MG /DOS, (OZEMPIC, 0.25 OR 0.5 MG/DOSE,) 2 MG/1.5ML SOPN, Inject 0.5 mg into the skin once a week., Disp: 4.5 mL, Rfl: 1   triamcinolone cream (KENALOG) 0.1 %, Apply 1 application topically 2 (two) times daily., Disp: , Rfl:    vitamin B-12  (CYANOCOBALAMIN) 1000 MCG tablet, Take 1,000 mcg by mouth daily., Disp: , Rfl:    Review of Systems:   ROS Negative unless otherwise specified per HPI. Vitals:   Vitals:   04/29/21 1347  BP: 116/72  Pulse: 65  Temp: 98 F (36.7 C)  SpO2: 97%  Weight: 203 lb (92.1 kg)  Height: 5\' 3"  (1.6 m)     Body mass index is 35.96 kg/m.  Physical Exam:   Physical Exam Vitals and nursing note reviewed.  Constitutional:      General: She is not in acute distress.    Appearance: She is well-developed. She is not ill-appearing or toxic-appearing.  HENT:     Head: Normocephalic and atraumatic.     Right Ear: Tympanic membrane, ear canal and external ear normal. Tympanic membrane is not erythematous, retracted or bulging.     Left Ear: Tympanic membrane, ear canal and external ear normal. Tympanic membrane is not erythematous, retracted or bulging.     Nose: Nose normal.     Right Sinus: No maxillary sinus tenderness or frontal sinus tenderness.     Left Sinus: No maxillary sinus tenderness or frontal sinus tenderness.     Mouth/Throat:     Pharynx: Uvula midline. No posterior oropharyngeal erythema.  Eyes:     General: Lids are normal.     Conjunctiva/sclera: Conjunctivae normal.  Neck:     Trachea: Trachea normal.  Cardiovascular:     Rate and Rhythm: Normal rate and regular rhythm.     Pulses: Normal pulses.     Heart sounds: Normal heart sounds, S1 normal and S2 normal.  Pulmonary:     Effort: Pulmonary effort is normal.     Breath sounds: Normal breath sounds. No decreased breath sounds, wheezing, rhonchi or rales.  Lymphadenopathy:     Cervical: No cervical adenopathy.  Skin:    General: Skin is warm and dry.  Neurological:     Mental Status: She is alert.     GCS: GCS eye subscore is 4. GCS verbal subscore is 5. GCS motor subscore is 6.  Psychiatric:        Speech: Speech normal.        Behavior: Behavior normal. Behavior is cooperative.    Assessment and Plan:    Type 2 diabetes mellitus without complication, without long-term current use of insulin (HCC) Update HgbA1c today, will adjust medication as indicated  Continue Metformin XR 750 mg daily  Will likely increase Ozempic to 0.5 mg weekly injection  Follow-up in 6 months, sooner if concerns  Leukopenia, unspecified type Update labs  today, will make recommendations accordingly  We will also obtain a smear  Abnormal sensation in both ears Exam benign; no intervention needed at this time Continue to monitor  I advised patient to stop use of q-tips for prevention of possible ear injury  Follow up if new/worsening symptoms occurs   Hyperlipidemia Patient cannot tolerate Lipitor, will trial pravastatin 20 mg daily I have asked her to reach out to let us know if she develops similar symptoms with this medication   I,[name],acting as a scribe for [employer], PA.,have documented all relevant documentation on the behalf of [name], PA,as directed by  [name], PA while in the presence of [name], [place].  I, [name], [place], have reviewed all documentation for this visit. The documentation on 04/29/21 for the exam, diagnosis, procedures, and orders are all accurate and complete.   [name], PA-C

## 2021-04-30 LAB — PATHOLOGIST SMEAR REVIEW

## 2021-06-14 ENCOUNTER — Telehealth: Payer: Self-pay | Admitting: Physician Assistant

## 2021-06-14 NOTE — Telephone Encounter (Signed)
Spoke to Felicia Ramirez at [facility] who states that Ozempic pens no longer come in 1.5 mL pens, just 3 mL pens. Gave verbal OK to change script.  ?

## 2021-06-14 NOTE — Telephone Encounter (Signed)
Mary from [facility] calling for ozempic medication update and clarification - please call [phone] to speak to pharmacy re patient-  ?

## 2021-06-19 ENCOUNTER — Other Ambulatory Visit: Payer: Self-pay | Admitting: Physician Assistant

## 2021-06-19 ENCOUNTER — Telehealth: Payer: Self-pay | Admitting: *Deleted

## 2021-06-19 MED ORDER — OZEMPIC (0.25 OR 0.5 MG/DOSE) 2 MG/3ML ~~LOC~~ SOPN: 0.5000 mg | PEN_INJECTOR | SUBCUTANEOUS | 1 refills | Status: DC

## 2021-06-19 NOTE — Telephone Encounter (Signed)
[facility] requesting change on Rx Ozempic ?Rx Ozempic '2mg'$ /24m ?Inject 0.'5mg'$  into the skin once per week  ?#90 ? ?Have changed the way supplied but same dosing Please advise  ?

## 2021-07-09 ENCOUNTER — Telehealth: Payer: Self-pay | Admitting: *Deleted

## 2021-07-09 NOTE — Telephone Encounter (Signed)
PA  (Key: [id number]) ?Rx #: [id number] ?Ozempic (0.25 or 0.5 MG/DOSE) '2MG'$ /3ML pen-injectors ?Waiting for determination  ?

## 2021-07-10 NOTE — Telephone Encounter (Signed)
[place] Ozempic [place]  Start Date:06/09/2021;Coverage End Date:07/09/2022; ?Pharmacy notified ?

## 2021-08-06 ENCOUNTER — Other Ambulatory Visit: Payer: Self-pay | Admitting: Physician Assistant

## 2021-09-17 ENCOUNTER — Other Ambulatory Visit: Payer: Self-pay | Admitting: Physician Assistant

## 2021-10-11 LAB — HM DIABETES EYE EXAM

## 2021-10-16 ENCOUNTER — Encounter: Payer: Self-pay | Admitting: Physician Assistant

## 2021-10-28 ENCOUNTER — Ambulatory Visit (INDEPENDENT_AMBULATORY_CARE_PROVIDER_SITE_OTHER): Payer: Managed Care, Other (non HMO)

## 2021-10-28 ENCOUNTER — Encounter: Payer: Self-pay | Admitting: Physician Assistant

## 2021-10-28 ENCOUNTER — Ambulatory Visit: Payer: Managed Care, Other (non HMO) | Admitting: Physician Assistant

## 2021-10-28 ENCOUNTER — Telehealth: Payer: Self-pay | Admitting: *Deleted

## 2021-10-28 ENCOUNTER — Other Ambulatory Visit: Payer: Self-pay | Admitting: Physician Assistant

## 2021-10-28 VITALS — BP 110/70 | HR 57 | Temp 98.2°F | Ht 63.0 in | Wt 201.4 lb

## 2021-10-28 DIAGNOSIS — E119 Type 2 diabetes mellitus without complications: Secondary | ICD-10-CM | POA: Diagnosis not present

## 2021-10-28 DIAGNOSIS — R29818 Other symptoms and signs involving the nervous system: Secondary | ICD-10-CM

## 2021-10-28 DIAGNOSIS — M79602 Pain in left arm: Secondary | ICD-10-CM

## 2021-10-28 DIAGNOSIS — R42 Dizziness and giddiness: Secondary | ICD-10-CM

## 2021-10-28 DIAGNOSIS — E785 Hyperlipidemia, unspecified: Secondary | ICD-10-CM | POA: Diagnosis not present

## 2021-10-28 NOTE — Telephone Encounter (Signed)
Tried to contact pt no answer, says not available try again later. Will try again tomorrow.  MRI is scheduled for Friday August 4th at [facility]. Pt needs to arrive at 1:30 for 2:00 PM appointment.

## 2021-10-28 NOTE — Progress Notes (Signed)
Felicia Ramirez is a 61 y.o. female here for a follow up on diabetes.   History of Present Illness:   Chief Complaint  Patient presents with   Diabetes    Pt has an episode about a month ago, twitching of left side of mouth and left arm felt funny. She took and Ibuprofen 1200 mg and in the morning just had slight twitch yet.    HPI  Diabetes 6 month follow-up. Current DM meds: Metformin XR 750 mg daily and Ozempic 0.5 mg weekly injection. Blood sugars at home are: not checked often. Per pt, her fasting sugars are 101 at home. Patient is  compliant with medications. She is tolerating her medication and denies any side effects. Denies: hypoglycemic or hyperglycemic episodes or symptoms.   Lab Results  Component Value Date   HGBA1C 6.0 04/29/2021    HLD Patient is currently taking Pravastatin 20 mg daily with no complications. She is tolerating this well. Denies any side effects. Denies any other concerns.   Mouth Twitching  Patient reports that she was experiencing mouth twitching about a month ago. Located on left side. She has eaten about half piece of bacon at that time. States she woke up around 3-4 am that night and has noticed "funny and weird" feeling in her left arm. States she was feeling something was crawling up her arm. Per pt, she has then noticed twitching of the left side of her mouth. She took ibuprofen and then was able to sleep after this episode. States she woke up after 2 hour for work. Has had noticed arm soreness but states mouth twitching was better upon waking.   She has tried drinking coffee that morning but felt like this was making her symptoms worse. She states she was having recurrent feeling in her left arm. She has tried taking Ibuprofen 1200 mg with some improvement. She has had difficulty focusing for two weeks after this episode. Per pt, she has noticed some arm pain if she lift heavy boxes at her work. At this time she does believe that the mouth  twitching has improved as well as the left arm pain. Has not had similar issue since then. Does feel occasional shortness of breath. No shortness of breath or chest pain. No drooping of face. No vision changes, weakness on one side of the body or tingling.    Past Medical History:  Diagnosis Date   ALLERGIC RHINITIS    Asthma    cough variant   Diabetes mellitus without complication ([place])    Fibroid    Former smoker, stopped smoking in distant past    quit 2001   History of anemia    HSV-2 (herpes simplex virus 2) infection    + Bloodwork   Neck pain    Vaginal delivery    1984     Social History   Tobacco Use   Smoking status: Former    Packs/day: 0.25    Years: 11.00    Total pack years: 2.75    Types: Cigarettes    Quit date: 05/20/2000    Years since quitting: 21.4   Smokeless tobacco: Never  Vaping Use   Vaping Use: Never used  Substance Use Topics   Alcohol use: Not Currently   Drug use: No    Past Surgical History:  Procedure Laterality Date   NO PAST SURGERIES      Family History  Problem Relation Age of Onset   Diabetes Mother    Hypertension  Mother    Hypertension Sister    Diabetes Brother    Hypertension Brother    Esophageal cancer Maternal Grandmother    Throat cancer Maternal Grandmother    Kidney disease Maternal Grandfather    Asthma Daughter    Colon cancer Neg Hx    Rectal cancer Neg Hx    Stomach cancer Neg Hx     Allergies  Allergen Reactions   Peanut Butter Flavor Hives   Shrimp Extract Allergy Skin Test Other (See Comments)    Tingling   Eggs Or Egg-Derived Products Hives    Current Medications:   Current Outpatient Medications:    albuterol (VENTOLIN HFA) 108 (90 Base) MCG/ACT inhaler, Inhale 2 puffs into the lungs every 6 (six) hours as needed. Pt takes as needed, Disp: 18 g, Rfl: 2   Cholecalciferol (VITAMIN D3) 50 MCG (2000 UT) capsule, Take 1 capsule by mouth daily., Disp: , Rfl:    fluticasone (FLOVENT HFA) 44  MCG/ACT inhaler, Inhale 1 puff into the lungs 2 (two) times daily. Inhale 1 puff into the lungs 2 (two) times daily., Disp: 1 each, Rfl: 5   ipratropium (ATROVENT) 0.03 % nasal spray, Place 2 sprays into both nostrils as needed for rhinitis., Disp: , Rfl:    metFORMIN (GLUCOPHAGE-XR) 750 MG 24 hr tablet, Take 1 tablet by mouth once daily with breakfast, Disp: 90 tablet, Rfl: 0   OZEMPIC, 0.25 OR 0.5 MG/DOSE, 2 MG/3ML SOPN, INJECT 0.5 MG INTO THE SKIN ONCE A WEEK, Disp: 3 mL, Rfl: 0   pravastatin (PRAVACHOL) 20 MG tablet, Take 1 tablet (20 mg total) by mouth daily., Disp: 90 tablet, Rfl: 1   triamcinolone cream (KENALOG) 0.1 %, Apply 1 application topically 2 (two) times daily., Disp: , Rfl:    vitamin B-12 (CYANOCOBALAMIN) 1000 MCG tablet, Take 1,000 mcg by mouth daily., Disp: , Rfl:    atovaquone-proguanil (MALARONE) 250-100 MG TABS tablet, Take 1 tablet by mouth daily. (Patient not taking: Reported on 10/28/2021), Disp: , Rfl:    Review of Systems:   ROS Negative unless otherwise specified per HPI.   Vitals:   Vitals:   10/28/21 1339  BP: 110/70  Pulse: (!) 57  Temp: 98.2 F (36.8 C)  TempSrc: Temporal  SpO2: 95%  Weight: 201 lb 6.1 oz (91.3 kg)  Height: '5\' 3"'$  (1.6 m)     Body mass index is 35.67 kg/m.  Physical Exam:   Physical Exam Vitals and nursing note reviewed.  Constitutional:      General: She is not in acute distress.    Appearance: She is well-developed. She is not ill-appearing or toxic-appearing.  Cardiovascular:     Rate and Rhythm: Normal rate and regular rhythm.     Pulses: Normal pulses.     Heart sounds: Normal heart sounds, S1 normal and S2 normal.  Pulmonary:     Effort: Pulmonary effort is normal.     Breath sounds: Normal breath sounds.  Skin:    General: Skin is warm and dry.  Neurological:     General: No focal deficit present.     Mental Status: She is alert.     GCS: GCS eye subscore is 4. GCS verbal subscore is 5. GCS motor subscore is 6.      Cranial Nerves: Cranial nerves 2-12 are intact.     Sensory: Sensation is intact.     Motor: Motor function is intact.     Coordination: Coordination is intact.     Gait: Gait  is intact.  Psychiatric:        Speech: Speech normal.        Behavior: Behavior normal. Behavior is cooperative.     Assessment and Plan:   Type 2 diabetes mellitus without complication, without long-term current use of insulin (HCC) Recheck A1c and provide recommendations accordingly Continue Metformin XR 750 mg daily and Ozempic 0.5 mg weekly injection -- if uncontrolled, will increase Ozempic Follow-up in 3-6 months depending on level of control   Hyperlipidemia, unspecified hyperlipidemia type Update lipid panel and provide recommendations accordingly Recommend that she take her pravastatin regularly -- will adjust dose based on results  Focal neurological deficit Concern for possible TIA EKG tracing is personally reviewed.  EKG notes NSR.  No acute changes.  Recommend MRI brain, MRA head and neck, heart monitor and echocardiogram Update blood work today If new/worsening sx, recommend ER evaluation Discussed need to take statin regularly  I,Savera Zaman,acting as a scribe for [employer], PA.,have documented all relevant documentation on the behalf of [name], PA,as directed by  [name], PA while in the presence of [name], [place].   I, [name], [place], have reviewed all documentation for this visit. The documentation on 10/28/21 for the exam, diagnosis, procedures, and orders are all accurate and complete.  [name], PA-C

## 2021-10-28 NOTE — Patient Instructions (Signed)
It was great to see you!  We are going to order some tests given your recent concerning episode: -EKG (done today in office) -ultrasound of your heart (echocardiogram) -heart rate monitor -MRI of brain and neck -blood work  Continue your pravastatin 20 mg daily  I will be in touch with your blood work and imaging results.  If new/worsening symptoms -- go to the ER.  Let's follow-up in 6 months for diabetes, sooner if you have concerns.  If a referral was placed today --> you will be contacted for an appointment. Please note that routine referrals can sometimes take up to 3-4 weeks to process. Please call our office if you haven't heard anything after this time frame.  If blood work, urine studies, or any imaging was ordered today -->  we will release your results to you on your MyChart account (if you have chosen to sign up for this) with further instructions. You may see the results before I do, but when I review them I will send you a message with my report or have my staff call you if things need to be discussed. Please reply to my message with any questions.   Take care,  [name] PA-C

## 2021-10-28 NOTE — Progress Notes (Unsigned)
Enrolled for [employer] to mail a ZIO XT long term holter monitor to the patients address on file.   DOD to read. 

## 2021-10-29 LAB — COMPREHENSIVE METABOLIC PANEL
ALT: 9 U/L (ref 0–35)
AST: 14 U/L (ref 0–37)
Albumin: 4.2 g/dL (ref 3.5–5.2)
Alkaline Phosphatase: 40 U/L (ref 39–117)
BUN: 16 mg/dL (ref 6–23)
CO2: 29 mEq/L (ref 19–32)
Calcium: 9.7 mg/dL (ref 8.4–10.5)
Chloride: 106 mEq/L (ref 96–112)
Creatinine, Ser: 0.84 mg/dL (ref 0.40–1.20)
GFR: 76.41 mL/min (ref >60.00)
Glucose, Bld: 91 mg/dL (ref 70–99)
Potassium: 5 mEq/L (ref 3.5–5.1)
Sodium: 141 mEq/L (ref 135–145)
Total Bilirubin: 0.5 mg/dL (ref 0.2–1.2)
Total Protein: 6.9 g/dL (ref 6.0–8.3)

## 2021-10-29 LAB — HEMOGLOBIN A1C: Hgb A1c MFr Bld: 6 % (ref 4.6–6.5)

## 2021-10-29 LAB — CBC WITH DIFFERENTIAL/PLATELET
Basophils Absolute: 0 10*3/uL (ref 0.0–0.1)
Basophils Relative: 0.9 % (ref 0.0–3.0)
Eosinophils Absolute: 0.1 10*3/uL (ref 0.0–0.7)
Eosinophils Relative: 3 % (ref 0.0–5.0)
HCT: 33.3 % — ABNORMAL LOW (ref 36.0–46.0)
Hemoglobin: 11 g/dL — ABNORMAL LOW (ref 12.0–15.0)
Lymphocytes Relative: 34.5 % (ref 12.0–46.0)
Lymphs Abs: 1 10*3/uL (ref 0.7–4.0)
MCHC: 33 g/dL (ref 30.0–36.0)
MCV: 94.9 fl (ref 78.0–100.0)
Monocytes Absolute: 0.2 10*3/uL (ref 0.1–1.0)
Monocytes Relative: 7.7 % (ref 3.0–12.0)
Neutro Abs: 1.6 10*3/uL (ref 1.4–7.7)
Neutrophils Relative %: 53.9 % (ref 43.0–77.0)
Platelets: 210 10*3/uL (ref 150.0–400.0)
RBC: 3.51 Mil/uL — ABNORMAL LOW (ref 3.87–5.11)
RDW: 15 % (ref 11.5–15.5)
WBC: 3 10*3/uL — ABNORMAL LOW (ref 4.0–10.5)

## 2021-10-29 LAB — LIPID PANEL
Cholesterol: 170 mg/dL (ref 0–200)
HDL: 85 mg/dL (ref >39.00)
LDL Cholesterol: 77 mg/dL (ref 0–99)
NonHDL: 85.41
Total CHOL/HDL Ratio: 2
Triglycerides: 42 mg/dL (ref 0.0–149.0)
VLDL: 8.4 mg/dL (ref 0.0–40.0)

## 2021-10-29 LAB — MICROALBUMIN / CREATININE URINE RATIO
Creatinine,U: 177.4 mg/dL
Microalb Creat Ratio: 0.6 mg/g (ref 0.0–30.0)
Microalb, Ur: 1.1 mg/dL (ref 0.0–1.9)

## 2021-10-29 NOTE — Telephone Encounter (Signed)
Pt called back, told her she left yesterday before we could give her information. MRI is scheduled for Friday August 4th at [facility]. Pt needs to arrive at 1:30 for 2:00 PM appointment. Pt verbalized understanding.

## 2021-10-29 NOTE — Telephone Encounter (Signed)
Called and spoke to pt's daughter told her I have been trying to reach pt and unable, her phone will not let me leave a message. Eboni said she just spoke to her she will tell her to call the office. Told her thank you.

## 2021-11-01 ENCOUNTER — Ambulatory Visit (HOSPITAL_COMMUNITY)
Admission: RE | Admit: 2021-11-01 | Discharge: 2021-11-01 | Disposition: A | Discharge status: Home / Self Care | Payer: Managed Care, Other (non HMO) | Source: Ambulatory Visit | Attending: Physician Assistant

## 2021-11-01 ENCOUNTER — Ambulatory Visit (HOSPITAL_COMMUNITY)
Admission: RE | Admit: 2021-11-01 | Discharge: 2021-11-01 | Disposition: A | Discharge status: Home / Self Care | Payer: Managed Care, Other (non HMO) | Source: Ambulatory Visit | Attending: Physician Assistant | Admitting: Physician Assistant

## 2021-11-01 DIAGNOSIS — M79602 Pain in left arm: Secondary | ICD-10-CM

## 2021-11-01 DIAGNOSIS — R29818 Other symptoms and signs involving the nervous system: Secondary | ICD-10-CM

## 2021-11-01 DIAGNOSIS — R42 Dizziness and giddiness: Secondary | ICD-10-CM | POA: Diagnosis not present

## 2021-11-06 ENCOUNTER — Ambulatory Visit (HOSPITAL_COMMUNITY): Payer: Managed Care, Other (non HMO) | Attending: Physician Assistant

## 2021-11-06 DIAGNOSIS — E785 Hyperlipidemia, unspecified: Secondary | ICD-10-CM | POA: Insufficient documentation

## 2021-11-06 DIAGNOSIS — Z87891 Personal history of nicotine dependence: Secondary | ICD-10-CM | POA: Insufficient documentation

## 2021-11-06 DIAGNOSIS — R29818 Other symptoms and signs involving the nervous system: Secondary | ICD-10-CM | POA: Diagnosis present

## 2021-11-06 DIAGNOSIS — G459 Transient cerebral ischemic attack, unspecified: Secondary | ICD-10-CM | POA: Diagnosis not present

## 2021-11-06 DIAGNOSIS — E669 Obesity, unspecified: Secondary | ICD-10-CM | POA: Insufficient documentation

## 2021-11-06 DIAGNOSIS — E119 Type 2 diabetes mellitus without complications: Secondary | ICD-10-CM | POA: Diagnosis not present

## 2021-11-06 LAB — ECHOCARDIOGRAM COMPLETE
Area-P 1/2: 3.31 cm2
S' Lateral: 2.4 cm

## 2021-11-12 ENCOUNTER — Encounter: Payer: Self-pay | Admitting: Physician Assistant

## 2021-11-18 ENCOUNTER — Other Ambulatory Visit: Payer: Self-pay | Admitting: Physician Assistant

## 2021-12-23 ENCOUNTER — Ambulatory Visit: Payer: Managed Care, Other (non HMO) | Admitting: Podiatry

## 2021-12-23 ENCOUNTER — Encounter: Payer: Self-pay | Admitting: *Deleted

## 2021-12-31 ENCOUNTER — Other Ambulatory Visit: Payer: Self-pay | Admitting: Physician Assistant

## 2021-12-31 DIAGNOSIS — Z1231 Encounter for screening mammogram for malignant neoplasm of breast: Secondary | ICD-10-CM

## 2022-01-31 ENCOUNTER — Other Ambulatory Visit: Payer: Self-pay | Admitting: Physician Assistant

## 2022-01-31 ENCOUNTER — Ambulatory Visit
Admission: RE | Admit: 2022-01-31 | Discharge: 2022-01-31 | Disposition: A | Discharge status: Home / Self Care | Payer: Managed Care, Other (non HMO) | Source: Ambulatory Visit | Attending: Physician Assistant | Admitting: Physician Assistant

## 2022-01-31 DIAGNOSIS — Z1231 Encounter for screening mammogram for malignant neoplasm of breast: Secondary | ICD-10-CM

## 2022-03-13 ENCOUNTER — Encounter: Payer: Self-pay | Admitting: *Deleted

## 2022-04-17 ENCOUNTER — Other Ambulatory Visit: Payer: Self-pay | Admitting: Physician Assistant

## 2022-05-16 ENCOUNTER — Ambulatory Visit: Payer: Managed Care, Other (non HMO) | Admitting: Podiatry

## 2022-05-16 DIAGNOSIS — M79675 Pain in left toe(s): Secondary | ICD-10-CM | POA: Diagnosis not present

## 2022-05-16 DIAGNOSIS — M79674 Pain in right toe(s): Secondary | ICD-10-CM

## 2022-05-16 DIAGNOSIS — E119 Type 2 diabetes mellitus without complications: Secondary | ICD-10-CM | POA: Diagnosis not present

## 2022-05-16 DIAGNOSIS — B351 Tinea unguium: Secondary | ICD-10-CM | POA: Diagnosis not present

## 2022-05-16 MED ORDER — TERBINAFINE HCL 250 MG PO TABS
250.0000 mg | ORAL_TABLET | Freq: Every day | ORAL | 0 refills | Status: AC
Start: 2022-05-16 — End: ?

## 2022-05-16 NOTE — Progress Notes (Signed)
Chief Complaint  Patient presents with   Foot Pain    Patient came in today for routine foot care, nail tim, nail fungus, left foot callus,     SUBJECTIVE Patient with a history of diabetes mellitus presents to office today complaining of elongated, thickened nails that cause pain while ambulating in shoes.  Patient is unable to trim their own nails. Patient is here for further evaluation and treatment.  Past Medical History:  Diagnosis Date   ALLERGIC RHINITIS    Asthma    cough variant   Diabetes mellitus without complication ([place])    Fibroid    Former smoker, stopped smoking in distant past    quit 2001   History of anemia    HSV-2 (herpes simplex virus 2) infection    + Bloodwork   Neck pain    Vaginal delivery    1984    Allergies  Allergen Reactions   Peanut Butter Flavor Hives   Shrimp Extract Allergy Skin Test Other (See Comments)    Tingling   Eggs Or Egg-Derived Products Hives     OBJECTIVE General Patient is awake, alert, and oriented x 3 and in no acute distress. Derm Skin is dry and supple bilateral. Negative open lesions or macerations. Remaining integument unremarkable. Nails are tender, long, thickened and dystrophic with subungual debris, consistent with onychomycosis, 1-5 bilateral. No signs of infection noted.  Hyperkeratotic preulcerative callus also noted to the plantar aspect of the fifth MTP left Vasc  DP and PT pedal pulses palpable bilaterally. Temperature gradient within normal limits.  Neuro Epicritic and protective threshold sensation diminished bilaterally.  Musculoskeletal Exam No symptomatic pedal deformities noted bilateral. Muscular strength within normal limits.  ASSESSMENT 1. Diabetes Mellitus w/ peripheral neuropathy 2.  Pain due to onychomycosis of toenails bilateral 3.  Symptomatic callus plantar aspect of the fifth MTP left  PLAN OF CARE 1. Patient evaluated today.  Comprehensive diabetic foot exam performed today.  Excisional  debridement of the hyperkeratotic preulcerative callus lesion to the plantar aspect of the fifth MTP performed today using a 312 scalpel without incident or bleeding.  Patient felt significant relief.  Recommend OTC corn callus remover as needed 2. Instructed to maintain good pedal hygiene and foot care. Stressed importance of controlling blood sugar.  3. Mechanical debridement of nails 1-5 bilaterally performed using a nail nipper. Filed with dremel without incident.  4.  Today also we discussed different treatment options for onychomycosis and fungal nail infection.  Discussed both oral, topical, and laser antifungal treatment modalities.  Risk benefits advantages and disadvantages as well as growth and efficacies were explained in detail.  After discussion with the patient we decided to pursue both topical and oral at the same time to help cure the fungal nail infection. 5.  CMP on 10/28/2021 WNL.  Patient denies a history of liver pathology or symptoms.  Prescription for Lamisil 250 mg #90 daily 6.  Tolcylen antifungal topical also dispensed at checkout 7.  Return to clinic 3 months    Edrick Kins, DPM [facility]  Dr. Edrick Kins, DPM    [address], [place] [address]                Office [phone]  Fax [phone]

## 2022-08-18 ENCOUNTER — Ambulatory Visit: Payer: Managed Care, Other (non HMO) | Admitting: Podiatry

## 2023-01-06 ENCOUNTER — Other Ambulatory Visit: Payer: Self-pay | Admitting: Physician Assistant

## 2023-01-06 DIAGNOSIS — Z1231 Encounter for screening mammogram for malignant neoplasm of breast: Secondary | ICD-10-CM

## 2023-01-13 IMAGING — MG MM DIGITAL SCREENING BILAT W/ TOMO AND CAD
8 series · 8 of 24 positions shown · non-contrast
Comparison: Previous exam(s).

CLINICAL DATA: Screening.

EXAM:
DIGITAL SCREENING BILATERAL MAMMOGRAM WITH TOMOSYNTHESIS AND CAD
TECHNIQUE: Bilateral screening digital craniocaudal and mediolateral oblique
mammograms were obtained. Bilateral screening digital breast
tomosynthesis was performed. The images were evaluated with
computer-aided detection.

[R MLO synth-2D]
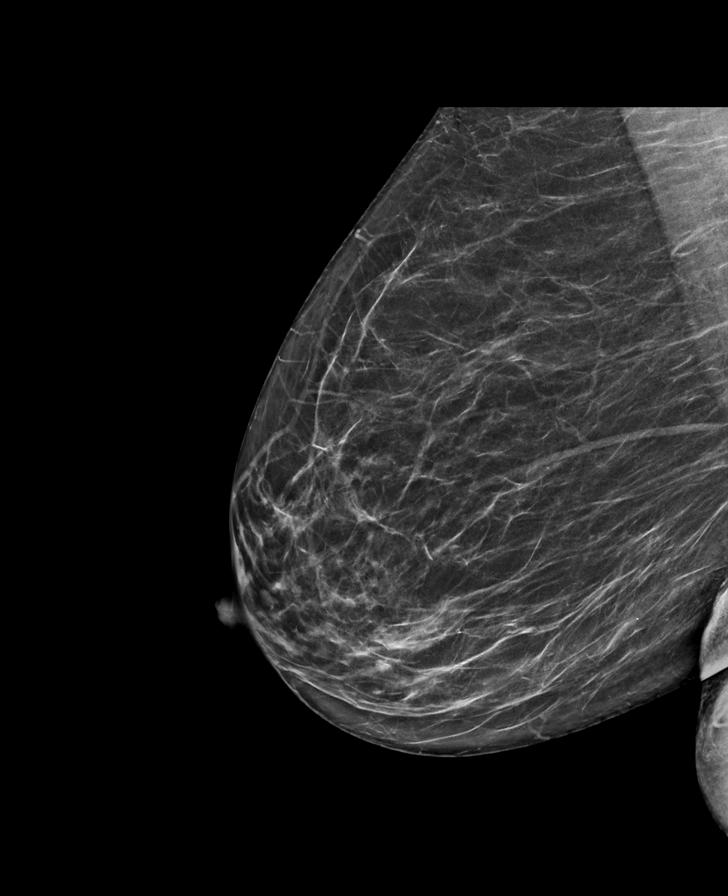

[L CC synth-2D]
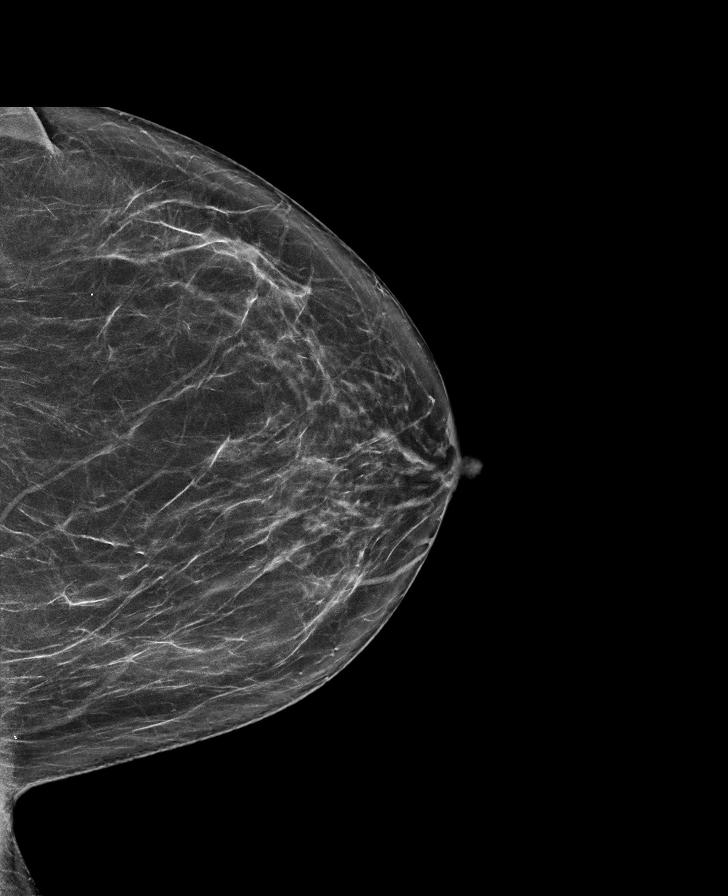

[R CC synth-2D]
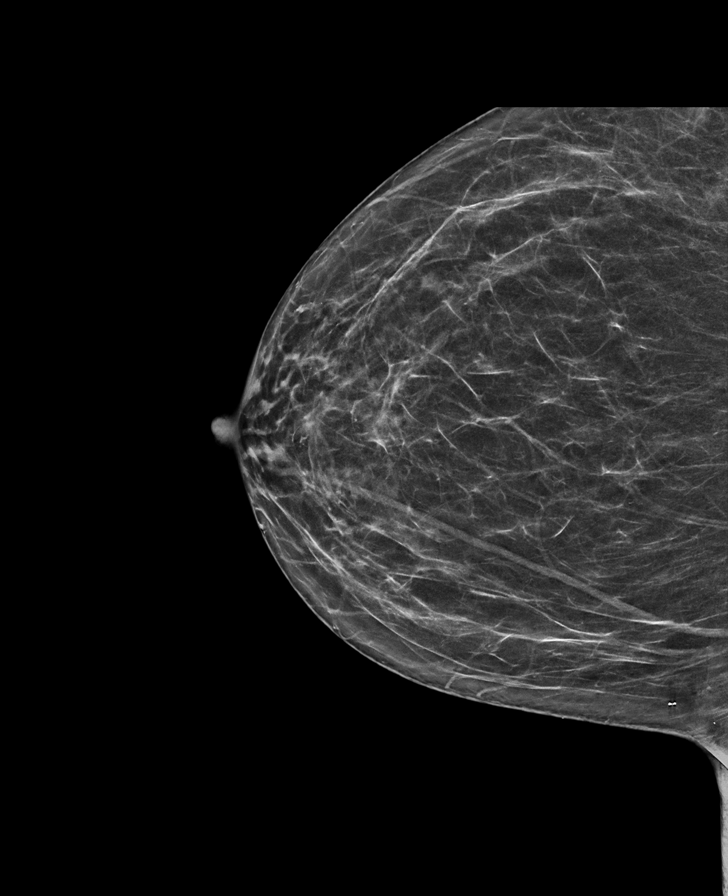

[L MLO synth-2D]
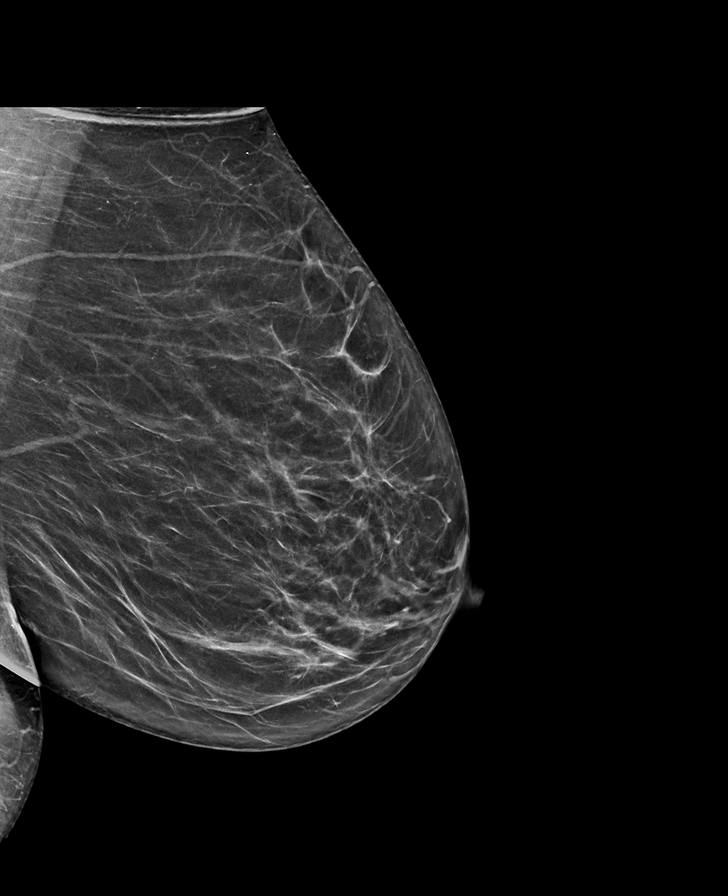

[R MLO tomo · tomo slice 35/69.0]
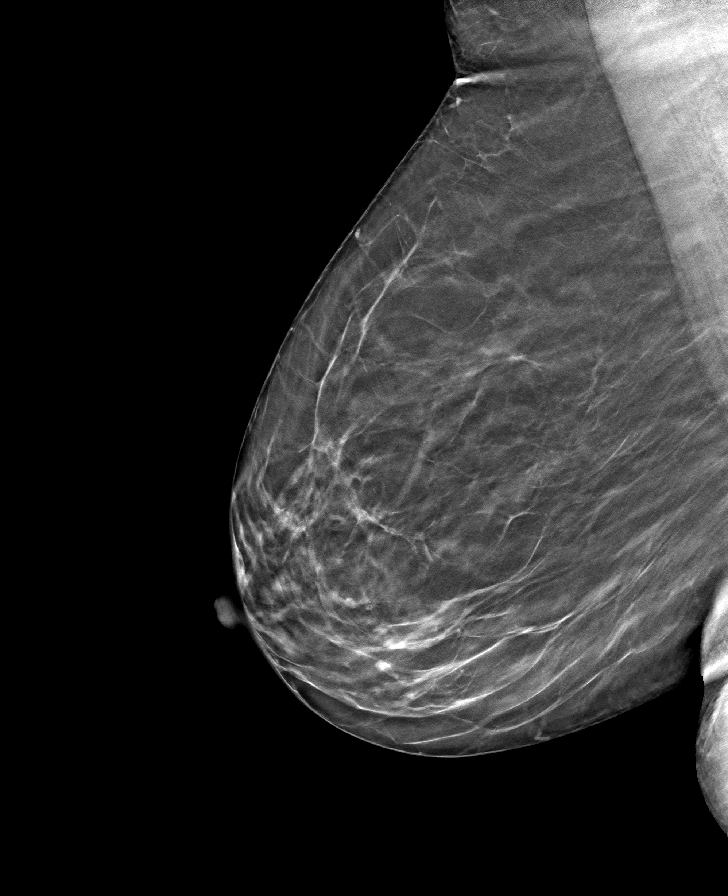

[R CC tomo · tomo slice 33/64.0]
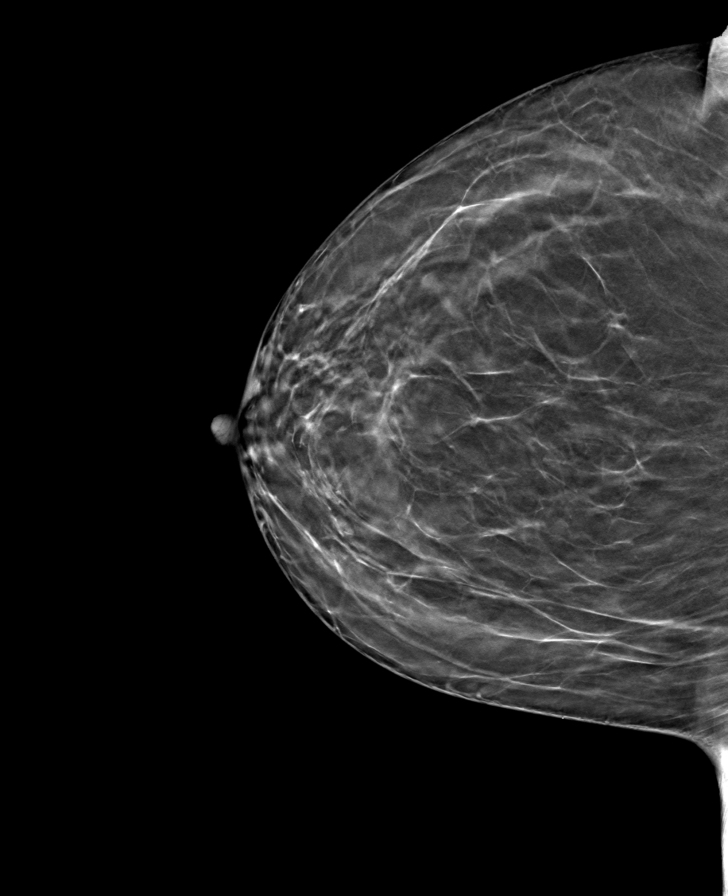

[L CC tomo · tomo slice 35/69.0]
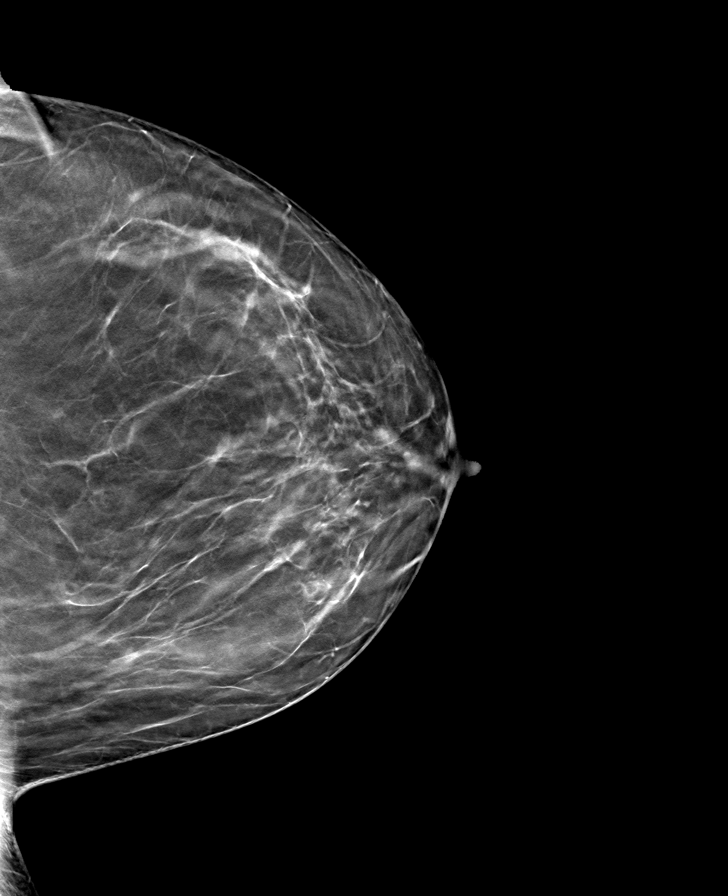

[L MLO tomo · tomo slice 35/69.0]
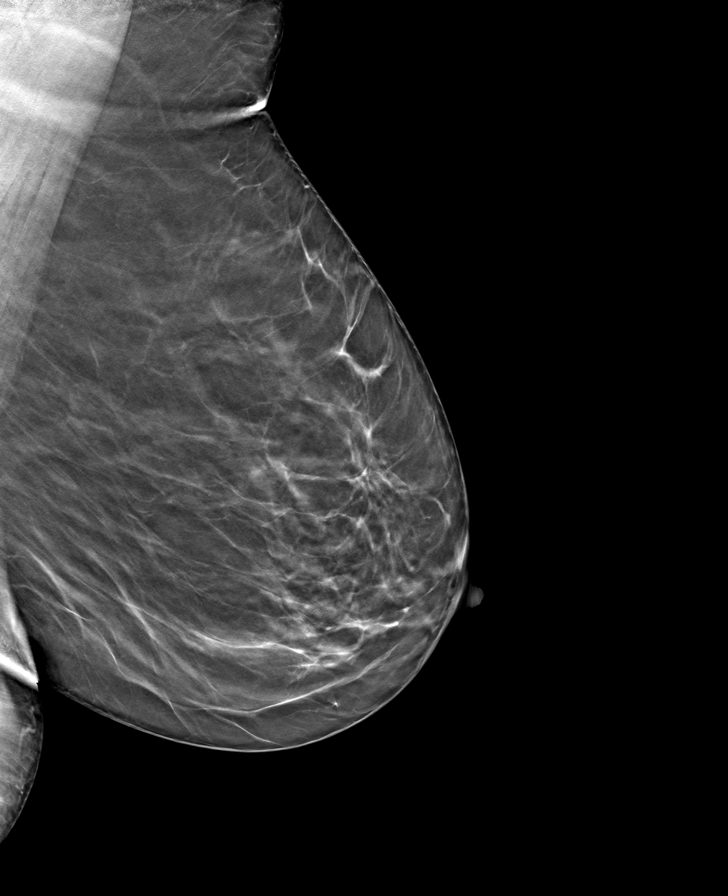

[8 of 24 positions shown; findings below may reference images not displayed]

ACR Breast Density Category b: There are scattered areas of
fibroglandular density.
FINDINGS: There are no findings suspicious for malignancy.
IMPRESSION: No mammographic evidence of malignancy. A result letter of this
screening mammogram will be mailed directly to the patient.

RECOMMENDATION:
Screening mammogram in one year. (Code:51-O-LD2)

BI-RADS CATEGORY  1: Negative.

## 2023-02-06 ENCOUNTER — Ambulatory Visit
Admission: RE | Admit: 2023-02-06 | Discharge: 2023-02-06 | Disposition: A | Discharge status: Home / Self Care | Payer: Managed Care, Other (non HMO) | Source: Ambulatory Visit | Attending: Physician Assistant

## 2023-02-06 DIAGNOSIS — Z1231 Encounter for screening mammogram for malignant neoplasm of breast: Secondary | ICD-10-CM

## 2023-03-03 ENCOUNTER — Encounter: Payer: Self-pay | Admitting: Family Medicine

## 2023-03-03 ENCOUNTER — Ambulatory Visit: Payer: Managed Care, Other (non HMO) | Admitting: Family Medicine

## 2023-03-03 VITALS — BP 137/86 | HR 47 | Temp 98.1°F | Resp 16 | Ht 63.5 in | Wt 222.6 lb

## 2023-03-03 DIAGNOSIS — E119 Type 2 diabetes mellitus without complications: Secondary | ICD-10-CM

## 2023-03-03 DIAGNOSIS — Z6838 Body mass index (BMI) 38.0-38.9, adult: Secondary | ICD-10-CM

## 2023-03-03 DIAGNOSIS — L2084 Intrinsic (allergic) eczema: Secondary | ICD-10-CM

## 2023-03-03 DIAGNOSIS — Z7689 Persons encountering health services in other specified circumstances: Secondary | ICD-10-CM

## 2023-03-03 DIAGNOSIS — J4520 Mild intermittent asthma, uncomplicated: Secondary | ICD-10-CM | POA: Diagnosis not present

## 2023-03-03 DIAGNOSIS — E66812 Obesity, class 2: Secondary | ICD-10-CM

## 2023-03-03 DIAGNOSIS — L309 Dermatitis, unspecified: Secondary | ICD-10-CM

## 2023-03-03 DIAGNOSIS — E785 Hyperlipidemia, unspecified: Secondary | ICD-10-CM | POA: Diagnosis not present

## 2023-03-03 DIAGNOSIS — Z7984 Long term (current) use of oral hypoglycemic drugs: Secondary | ICD-10-CM

## 2023-03-03 LAB — POCT GLYCOSYLATED HEMOGLOBIN (HGB A1C): Hemoglobin A1C: 5.9 % — AB (ref 4.0–5.6)

## 2023-03-03 MED ORDER — TRIAMCINOLONE ACETONIDE 0.1 % EX CREA: 1.0000 | TOPICAL_CREAM | Freq: Two times a day (BID) | CUTANEOUS | 1 refills | Status: AC

## 2023-03-03 MED ORDER — PRAVASTATIN SODIUM 20 MG PO TABS: 20.0000 mg | ORAL_TABLET | Freq: Every day | ORAL | 0 refills | Status: AC

## 2023-03-03 MED ORDER — OZEMPIC (0.25 OR 0.5 MG/DOSE) 2 MG/3ML ~~LOC~~ SOPN: 0.5000 mg | PEN_INJECTOR | SUBCUTANEOUS | 0 refills | Status: AC

## 2023-03-03 MED ORDER — ALBUTEROL SULFATE HFA 108 (90 BASE) MCG/ACT IN AERS: 2.0000 | INHALATION_SPRAY | Freq: Four times a day (QID) | RESPIRATORY_TRACT | 2 refills | Status: AC | PRN

## 2023-03-03 NOTE — Progress Notes (Unsigned)
New Patient Office Visit  Subjective    Patient ID: Felicia Ramirez, female    DOB: 06-07-1960  Age: 62 y.o. MRN: [id number]  CC:  Chief Complaint  Patient presents with   Establish Care    Medication refill, whizzing, eczema    HPI Felicia Ramirez presents to establish care and for review of chronic med issues including diabetes and asthma. Patient reports that she has had some intermittent wheezes primarily at night. This has been since the weather has gotten colder. Patient reports that she has not been taking any meds for her diabetes   Outpatient Encounter Medications as of 03/03/2023  Medication Sig   Cholecalciferol (VITAMIN D3) 50 MCG (2000 UT) capsule Take 1 capsule by mouth daily.   fluticasone (FLOVENT HFA) 44 MCG/ACT inhaler Inhale 1 puff into the lungs 2 (two) times daily. Inhale 1 puff into the lungs 2 (two) times daily.   terbinafine (LAMISIL) 250 MG tablet Take 1 tablet (250 mg total) by mouth daily.   [DISCONTINUED] albuterol (VENTOLIN HFA) 108 (90 Base) MCG/ACT inhaler Inhale 2 puffs into the lungs every 6 (six) hours as needed. Pt takes as needed   [DISCONTINUED] pravastatin (PRAVACHOL) 20 MG tablet Take 1 tablet by mouth once daily   [DISCONTINUED] triamcinolone cream (KENALOG) 0.1 % Apply 1 application topically 2 (two) times daily.   albuterol (VENTOLIN HFA) 108 (90 Base) MCG/ACT inhaler Inhale 2 puffs into the lungs every 6 (six) hours as needed. Pt takes as needed   atovaquone-proguanil (MALARONE) 250-100 MG TABS tablet Take 1 tablet by mouth daily. (Patient not taking: Reported on 10/28/2021)   ipratropium (ATROVENT) 0.03 % nasal spray Place 2 sprays into both nostrils as needed for rhinitis.   metFORMIN (GLUCOPHAGE-XR) 750 MG 24 hr tablet Take 1 tablet by mouth once daily with breakfast (Patient not taking: Reported on 05/16/2022)   pravastatin (PRAVACHOL) 20 MG tablet Take 1 tablet (20 mg total) by mouth daily.   Semaglutide,0.25 or  0.5MG /DOS, (OZEMPIC, 0.25 OR 0.5 MG/DOSE,) 2 MG/3ML SOPN Inject 0.5 mg into the skin once a week.   triamcinolone cream (KENALOG) 0.1 % Apply 1 Application topically 2 (two) times daily.   vitamin B-12 (CYANOCOBALAMIN) 1000 MCG tablet Take 1,000 mcg by mouth daily. (Patient not taking: Reported on 03/03/2023)   [DISCONTINUED] OZEMPIC, 0.25 OR 0.5 MG/DOSE, 2 MG/3ML SOPN INJECT 0.5 MG INTO THE SKIN ONCE A WEEK (Patient not taking: Reported on 05/16/2022)   No facility-administered encounter medications on file as of 03/03/2023.    Past Medical History:  Diagnosis Date   ALLERGIC RHINITIS    Asthma    cough variant   Diabetes mellitus without complication (HCC)    Fibroid    Former smoker, stopped smoking in distant past    quit 2001   History of anemia    HSV-2 (herpes simplex virus 2) infection    + Bloodwork   Neck pain    Vaginal delivery    1984    Past Surgical History:  Procedure Laterality Date   NO PAST SURGERIES      Family History  Problem Relation Age of Onset   Diabetes Mother    Hypertension Mother    Hypertension Sister    Diabetes Brother    Hypertension Brother    Esophageal cancer Maternal Grandmother    Throat cancer Maternal Grandmother    Kidney disease Maternal Grandfather    Asthma Daughter    Colon cancer Neg Hx    Rectal  cancer Neg Hx    Stomach cancer Neg Hx     Social History   Socioeconomic History   Marital status: Single    Spouse name: Not on file   Number of children: 1   Years of education: 12   Highest education level: Not on file  Occupational History   Occupation: CNA  Tobacco Use   Smoking status: Former    Current packs/day: 0.00    Average packs/day: 0.3 packs/day for 11.0 years (2.8 ttl pk-yrs)    Types: Cigarettes    Start date: 05/20/1989    Quit date: 05/20/2000    Years since quitting: 22.8   Smokeless tobacco: Never  Vaping Use   Vaping status: Never Used  Substance and Sexual Activity   Alcohol use: Not  Currently   Drug use: No   Sexual activity: Not Currently    Partners: Male    Birth control/protection: Post-menopausal  Other Topics Concern   Not on file  Social History Narrative   Works at [employer], works 4 x 12 hours   Lives alone   Daughter   3 grandchildren   Social Determinants of Health   Financial Resource Strain: Low Risk  (03/03/2023)   Overall Financial Resource Strain (CARDIA)    Difficulty of Paying Living Expenses: Not hard at all  Food Insecurity: No Food Insecurity (03/03/2023)   Hunger Vital Sign    Worried About Running Out of Food in the Last Year: Never true    Ran Out of Food in the Last Year: Never true  Transportation Needs: No Transportation Needs (03/03/2023)   PRAPARE - Administrator, Civil Service (Medical): No    Lack of Transportation (Non-Medical): No  Physical Activity: Sufficiently Active (03/03/2023)   Exercise Vital Sign    Days of Exercise per Week: 5 days    Minutes of Exercise per Session: 30 min  Stress: Stress Concern Present (03/03/2023)   [employer] of Occupational Health - Occupational Stress Questionnaire    Feeling of Stress : To some extent  Social Connections: Moderately Integrated (03/03/2023)   Social Connection and Isolation Panel [NHANES]    Frequency of Communication with Friends and Family: More than three times a week    Frequency of Social Gatherings with Friends and Family: Once a week    Attends Religious Services: More than 4 times per year    Active Member of [employer] or Organizations: Yes    Attends Banker Meetings: More than 4 times per year    Marital Status: Never married  Intimate Partner Violence: Not At Risk (03/03/2023)   Humiliation, Afraid, Rape, and Kick questionnaire    Fear of Current or Ex-Partner: No    Emotionally Abused: No    Physically Abused: No    Sexually Abused: No    Review of Systems  Respiratory:  Positive for cough and wheezing. Negative for  shortness of breath.   All other systems reviewed and are negative.       Objective    BP 137/86 (BP Location: Right Arm, Patient Position: Sitting, Cuff Size: Large)   Pulse (!) 47   Temp 98.1 F (36.7 C) (Oral)   Resp 16   Ht 5' 3.5" (1.613 m)   Wt 222 lb 9.6 oz (101 kg)   LMP 07/29/2013   BMI 38.81 kg/m   Physical Exam Vitals and nursing note reviewed.  Constitutional:      General: She is not  in acute distress.    Appearance: She is obese.  Cardiovascular:     Rate and Rhythm: Normal rate and regular rhythm.  Pulmonary:     Effort: Pulmonary effort is normal.     Breath sounds: Normal breath sounds. No wheezing.  Abdominal:     Palpations: Abdomen is soft.     Tenderness: There is no abdominal tenderness.  Neurological:     General: No focal deficit present.     Mental Status: She is alert and oriented to person, place, and time.         Assessment & Plan:   1. Diet-controlled diabetes mellitus (HCC) A1c at goal without meds. continue - Urine Albumin/Creatinine with ratio (send out) [LAB689] - HgB A1c  2. Hyperlipidemia, unspecified hyperlipidemia type continue  3. Mild intermittent asthma without complication Albuterol MDI prescribed  4. Intrinsic eczema Triamcinolone prescribed/refilled  5. Class 2 severe obesity due to excess calories with serious comorbidity and body mass index (BMI) of 38.0 to 38.9 in adult (HCC)   6. Encounter to establish care   Return in about 4 weeks (around 03/31/2023) for follow up.   Tommie Raymond, MD

## 2023-03-05 ENCOUNTER — Other Ambulatory Visit: Payer: Self-pay

## 2023-03-05 LAB — MICROALBUMIN / CREATININE URINE RATIO
Creatinine, Urine: 192 mg/dL
Microalb/Creat Ratio: 3 mg/g{creat} (ref 0–29)
Microalbumin, Urine: 6.1 ug/mL

## 2023-03-06 ENCOUNTER — Other Ambulatory Visit: Payer: Self-pay

## 2023-03-09 ENCOUNTER — Other Ambulatory Visit: Payer: Self-pay

## 2023-04-09 ENCOUNTER — Ambulatory Visit: Payer: Managed Care, Other (non HMO) | Admitting: Family Medicine

## 2024-01-13 ENCOUNTER — Other Ambulatory Visit: Payer: Self-pay | Admitting: Physician Assistant

## 2024-01-13 DIAGNOSIS — Z1231 Encounter for screening mammogram for malignant neoplasm of breast: Secondary | ICD-10-CM

## 2024-02-04 ENCOUNTER — Ambulatory Visit: Admitting: Family Medicine

## 2024-02-08 ENCOUNTER — Ambulatory Visit
Admission: RE | Admit: 2024-02-08 | Discharge: 2024-02-08 | Disposition: A | Discharge status: Home / Self Care | Source: Ambulatory Visit | Attending: Physician Assistant | Admitting: Physician Assistant

## 2024-02-08 DIAGNOSIS — Z1231 Encounter for screening mammogram for malignant neoplasm of breast: Secondary | ICD-10-CM

## 2024-03-07 ENCOUNTER — Encounter: Admitting: Family Medicine
